# Patient Record
Sex: Male | Born: 1950 | Race: White | Hispanic: No | Smoking: Never smoker
Health system: Southern US, Community
[De-identification: ages and names within clinical notes are randomized; demographics above are authoritative.]

## PROBLEM LIST (undated history)

## (undated) DIAGNOSIS — I10 Essential (primary) hypertension: Secondary | ICD-10-CM

## (undated) DIAGNOSIS — C61 Malignant neoplasm of prostate: Secondary | ICD-10-CM

## (undated) DIAGNOSIS — I499 Cardiac arrhythmia, unspecified: Secondary | ICD-10-CM

## (undated) DIAGNOSIS — K219 Gastro-esophageal reflux disease without esophagitis: Secondary | ICD-10-CM

## (undated) DIAGNOSIS — G4730 Sleep apnea, unspecified: Secondary | ICD-10-CM

## (undated) HISTORY — PX: COSMETIC SURGERY: SHX468

## (undated) HISTORY — DX: Sleep apnea, unspecified: G47.30

## (undated) HISTORY — PX: CIRCUMCISION: SUR203

## (undated) HISTORY — PX: BAND HEMORRHOIDECTOMY: SHX1213

## (undated) HISTORY — PX: NASAL FRACTURE SURGERY: SHX718

## (undated) HISTORY — PX: OTHER SURGICAL HISTORY: SHX169

## (undated) HISTORY — PX: FRACTURE SURGERY: SHX138

---

## 2001-03-25 ENCOUNTER — Encounter: Admission: RE | Admit: 2001-03-25 | Discharge: 2001-03-25 | Payer: Self-pay | Admitting: *Deleted

## 2001-03-25 ENCOUNTER — Encounter: Payer: Self-pay | Admitting: *Deleted

## 2005-09-17 ENCOUNTER — Emergency Department (HOSPITAL_COMMUNITY): Admission: EM | Admit: 2005-09-17 | Discharge: 2005-09-17 | Payer: Self-pay | Admitting: Emergency Medicine

## 2005-09-24 ENCOUNTER — Emergency Department (HOSPITAL_COMMUNITY): Admission: EM | Admit: 2005-09-24 | Discharge: 2005-09-24 | Payer: Self-pay | Admitting: Emergency Medicine

## 2006-08-12 ENCOUNTER — Emergency Department (HOSPITAL_COMMUNITY): Admission: EM | Admit: 2006-08-12 | Discharge: 2006-08-12 | Payer: Self-pay | Admitting: Emergency Medicine

## 2008-03-15 ENCOUNTER — Emergency Department (HOSPITAL_BASED_OUTPATIENT_CLINIC_OR_DEPARTMENT_OTHER): Admission: EM | Admit: 2008-03-15 | Discharge: 2008-03-15 | Payer: Self-pay | Admitting: Emergency Medicine

## 2010-06-04 ENCOUNTER — Encounter: Payer: Self-pay | Admitting: Occupational Medicine

## 2015-10-31 ENCOUNTER — Other Ambulatory Visit: Payer: Self-pay | Admitting: Orthopaedic Surgery

## 2015-11-10 ENCOUNTER — Encounter (HOSPITAL_BASED_OUTPATIENT_CLINIC_OR_DEPARTMENT_OTHER): Payer: Self-pay | Admitting: *Deleted

## 2015-11-16 NOTE — H&P (Signed)
Raymond Vega is an 65 y.o. male.   Chief Complaint: Right elbow pain HPI: Raymond Vega continues with right elbow swelling and a sharp pain.  We aspirated the elbow about a month back but the fluid came back.  He has trouble resting his elbow on anything.  He wonders what can be done as he has been struggling with this for about a year.    Radiographs:  X-rays that were ordered, performed, and interpreted by me today included 2 views of the right elbow.  He does have what appears to be a broken olecranon spur with some surrounding soft tissue swelling.  Past Medical History  Diagnosis Date  . GERD (gastroesophageal reflux disease)     Past Surgical History  Procedure Laterality Date  . Nasal fracture surgery    . Fracture surgery    . Circumcision    . Band hemorrhoidectomy      History reviewed. No pertinent family history. Social History:  reports that he has never smoked. He does not have any smokeless tobacco history on file. He reports that he does not drink alcohol or use illicit drugs.  Allergies: Allergies no known allergies  No prescriptions prior to admission    No results found for this or any previous visit (from the past 48 hour(s)). No results found.  Review of Systems  Musculoskeletal: Positive for joint pain.       Right elbow   All other systems reviewed and are negative.   Height 5\' 11"  (1.803 m), weight 83.915 kg (185 lb). Physical Exam  Constitutional: He is oriented to person, place, and time. He appears well-developed and well-nourished.  HENT:  Head: Normocephalic and atraumatic.  Eyes: Conjunctivae are normal. Pupils are equal, round, and reactive to light.  Neck: Normal range of motion.  Cardiovascular: Normal rate and regular rhythm.   Respiratory: Effort normal.  GI: Soft.  Musculoskeletal:  Right elbow motion is full.  He has a palpable effusion at the olecranon bursa and a loose body that he can feel underneath the skin.  There is no redness.   Sensation and motor function are intact in his hands with palpable pulses on both sides.  Opposite elbow has no such swelling.    Neurological: He is alert and oriented to person, place, and time.  Skin: Skin is warm and dry.  Psychiatric: He has a normal mood and affect. His behavior is normal. Judgment and thought content normal.     Assessment/Plan Assessment: Right olecranon bursitis and loose body aspirated 09/28/15  Plan: Raymond Vega would like to explore the possibility of fixing his elbow surgically.  I reviewed risk of anesthesia, infection, and wound healing problems related to an olecranon bursa excision with removal of the spur.  He would have to wear a sling for maybe 2 weeks.  He might miss work for 2-4 weeks.  We will use the usual 2 sets of sutures to tack down the loose tissues.    Simone Tuckey, Larwance Sachs, PA-C 11/16/2015, 8:19 AM

## 2015-11-18 ENCOUNTER — Encounter (HOSPITAL_BASED_OUTPATIENT_CLINIC_OR_DEPARTMENT_OTHER): Payer: Self-pay | Admitting: *Deleted

## 2015-11-18 ENCOUNTER — Encounter (HOSPITAL_BASED_OUTPATIENT_CLINIC_OR_DEPARTMENT_OTHER)
Admission: RE | Disposition: A | Discharge status: Home / Self Care | Payer: Self-pay | Source: Ambulatory Visit | Attending: Orthopaedic Surgery

## 2015-11-18 ENCOUNTER — Ambulatory Visit (HOSPITAL_BASED_OUTPATIENT_CLINIC_OR_DEPARTMENT_OTHER): Payer: BLUE CROSS/BLUE SHIELD | Admitting: Anesthesiology

## 2015-11-18 ENCOUNTER — Ambulatory Visit (HOSPITAL_BASED_OUTPATIENT_CLINIC_OR_DEPARTMENT_OTHER)
Admission: RE | Admit: 2015-11-18 | Discharge: 2015-11-18 | Disposition: A | Discharge status: Home / Self Care | Payer: BLUE CROSS/BLUE SHIELD | Source: Ambulatory Visit | Attending: Orthopaedic Surgery | Admitting: Orthopaedic Surgery

## 2015-11-18 DIAGNOSIS — M7021 Olecranon bursitis, right elbow: Secondary | ICD-10-CM | POA: Diagnosis not present

## 2015-11-18 DIAGNOSIS — K219 Gastro-esophageal reflux disease without esophagitis: Secondary | ICD-10-CM | POA: Diagnosis not present

## 2015-11-18 DIAGNOSIS — Z79899 Other long term (current) drug therapy: Secondary | ICD-10-CM | POA: Insufficient documentation

## 2015-11-18 DIAGNOSIS — M779 Enthesopathy, unspecified: Secondary | ICD-10-CM | POA: Diagnosis not present

## 2015-11-18 HISTORY — PX: OLECRANON BURSECTOMY: SHX2097

## 2015-11-18 HISTORY — DX: Gastro-esophageal reflux disease without esophagitis: K21.9

## 2015-11-18 SURGERY — BURSECTOMY, ELBOW
Anesthesia: Regional | Site: Elbow | Laterality: Right

## 2015-11-18 MED ORDER — DEXAMETHASONE SODIUM PHOSPHATE 10 MG/ML IJ SOLN
INTRAMUSCULAR | Status: AC
  Filled 2015-11-18: qty 1

## 2015-11-18 MED ORDER — PROMETHAZINE HCL 25 MG/ML IJ SOLN: 6.2500 mg | INTRAMUSCULAR | Status: DC | PRN

## 2015-11-18 MED ORDER — FENTANYL CITRATE (PF) 100 MCG/2ML IJ SOLN
INTRAMUSCULAR | Status: AC
  Filled 2015-11-18: qty 2

## 2015-11-18 MED ORDER — BUPIVACAINE-EPINEPHRINE (PF) 0.5% -1:200000 IJ SOLN
INTRAMUSCULAR | Status: DC | PRN
  Administered 2015-11-18: 30 mL via PERINEURAL
  Administered 2015-11-18: 10 mL via PERINEURAL

## 2015-11-18 MED ORDER — LACTATED RINGERS IV SOLN
INTRAVENOUS | Status: DC
  Administered 2015-11-18: 13:00:00 via INTRAVENOUS

## 2015-11-18 MED ORDER — CEFAZOLIN SODIUM-DEXTROSE 2-4 GM/100ML-% IV SOLN
2.0000 g | INTRAVENOUS | Status: AC
  Administered 2015-11-18: 2 g via INTRAVENOUS

## 2015-11-18 MED ORDER — MEPERIDINE HCL 25 MG/ML IJ SOLN: 6.2500 mg | INTRAMUSCULAR | Status: DC | PRN

## 2015-11-18 MED ORDER — CHLORHEXIDINE GLUCONATE 4 % EX LIQD: 60.0000 mL | Freq: Once | CUTANEOUS | Status: DC

## 2015-11-18 MED ORDER — SCOPOLAMINE 1 MG/3DAYS TD PT72: 1.0000 | MEDICATED_PATCH | Freq: Once | TRANSDERMAL | Status: DC | PRN

## 2015-11-18 MED ORDER — DEXAMETHASONE SODIUM PHOSPHATE 10 MG/ML IJ SOLN
INTRAMUSCULAR | Status: DC | PRN
  Administered 2015-11-18: 10 mg via INTRAVENOUS

## 2015-11-18 MED ORDER — HYDROMORPHONE HCL 1 MG/ML IJ SOLN: 0.2500 mg | INTRAMUSCULAR | Status: DC | PRN

## 2015-11-18 MED ORDER — LACTATED RINGERS IV SOLN
INTRAVENOUS | Status: DC
  Administered 2015-11-18 (×2): via INTRAVENOUS

## 2015-11-18 MED ORDER — FENTANYL CITRATE (PF) 100 MCG/2ML IJ SOLN
50.0000 ug | INTRAMUSCULAR | Status: DC | PRN
  Administered 2015-11-18: 100 ug via INTRAVENOUS

## 2015-11-18 MED ORDER — GLYCOPYRROLATE 0.2 MG/ML IJ SOLN: 0.2000 mg | Freq: Once | INTRAMUSCULAR | Status: DC | PRN

## 2015-11-18 MED ORDER — KETOROLAC TROMETHAMINE 30 MG/ML IJ SOLN
INTRAMUSCULAR | Status: AC
  Filled 2015-11-18: qty 1

## 2015-11-18 MED ORDER — LIDOCAINE 2% (20 MG/ML) 5 ML SYRINGE
INTRAMUSCULAR | Status: AC
  Filled 2015-11-18: qty 5

## 2015-11-18 MED ORDER — PROPOFOL 10 MG/ML IV BOLUS
INTRAVENOUS | Status: DC | PRN
  Administered 2015-11-18: 150 mg via INTRAVENOUS

## 2015-11-18 MED ORDER — ONDANSETRON HCL 4 MG/2ML IJ SOLN
INTRAMUSCULAR | Status: AC
  Filled 2015-11-18: qty 2

## 2015-11-18 MED ORDER — ONDANSETRON HCL 4 MG/2ML IJ SOLN
INTRAMUSCULAR | Status: DC | PRN
  Administered 2015-11-18: 4 mg via INTRAVENOUS

## 2015-11-18 MED ORDER — MIDAZOLAM HCL 2 MG/2ML IJ SOLN
INTRAMUSCULAR | Status: AC
  Filled 2015-11-18: qty 2

## 2015-11-18 MED ORDER — MIDAZOLAM HCL 2 MG/2ML IJ SOLN
1.0000 mg | INTRAMUSCULAR | Status: DC | PRN
  Administered 2015-11-18: 2 mg via INTRAVENOUS

## 2015-11-18 MED ORDER — EPHEDRINE SULFATE 50 MG/ML IJ SOLN
INTRAMUSCULAR | Status: DC | PRN
  Administered 2015-11-18 (×2): 10 mg via INTRAVENOUS

## 2015-11-18 MED ORDER — HYDROCODONE-ACETAMINOPHEN 5-325 MG PO TABS: 1.0000 | ORAL_TABLET | Freq: Four times a day (QID) | ORAL | Status: DC | PRN

## 2015-11-18 SURGICAL SUPPLY — 59 items
BANDAGE ACE 3X5.8 VEL STRL LF (GAUZE/BANDAGES/DRESSINGS) ×4 IMPLANT
BANDAGE ACE 4X5 VEL STRL LF (GAUZE/BANDAGES/DRESSINGS) IMPLANT
BLADE SURG 15 STRL LF DISP TIS (BLADE) ×2 IMPLANT
BLADE SURG 15 STRL SS (BLADE) ×4
BNDG CMPR 9X4 STRL LF SNTH (GAUZE/BANDAGES/DRESSINGS) ×1
BNDG ESMARK 4X9 LF (GAUZE/BANDAGES/DRESSINGS) ×2 IMPLANT
CANISTER SUCT 1200ML W/VALVE (MISCELLANEOUS) ×2 IMPLANT
COVER BACK TABLE 60X90IN (DRAPES) ×2 IMPLANT
COVER MAYO STAND STRL (DRAPES) ×2 IMPLANT
CUFF TOURNIQUET SINGLE 18IN (TOURNIQUET CUFF) ×2 IMPLANT
DECANTER SPIKE VIAL GLASS SM (MISCELLANEOUS) IMPLANT
DRAPE EXTREMITY T 121X128X90 (DRAPE) ×2 IMPLANT
DRAPE U-SHAPE 47X51 STRL (DRAPES) ×2 IMPLANT
DRSG EMULSION OIL 3X3 NADH (GAUZE/BANDAGES/DRESSINGS) ×2 IMPLANT
DRSG PAD ABDOMINAL 8X10 ST (GAUZE/BANDAGES/DRESSINGS) ×2 IMPLANT
DURAPREP 26ML APPLICATOR (WOUND CARE) ×2 IMPLANT
ELECT NEEDLE TIP 2.8 STRL (NEEDLE) ×2 IMPLANT
ELECT REM PT RETURN 9FT ADLT (ELECTROSURGICAL) ×2
ELECTRODE REM PT RTRN 9FT ADLT (ELECTROSURGICAL) ×1 IMPLANT
GAUZE SPONGE 4X4 12PLY STRL (GAUZE/BANDAGES/DRESSINGS) ×4 IMPLANT
GLOVE BIO SURGEON STRL SZ 6.5 (GLOVE) ×2 IMPLANT
GLOVE BIO SURGEON STRL SZ8 (GLOVE) ×4 IMPLANT
GLOVE BIOGEL PI IND STRL 7.0 (GLOVE) ×2 IMPLANT
GLOVE BIOGEL PI IND STRL 8 (GLOVE) ×2 IMPLANT
GLOVE BIOGEL PI INDICATOR 7.0 (GLOVE) ×2
GLOVE BIOGEL PI INDICATOR 8 (GLOVE) ×2
GOWN STRL REUS W/ TWL LRG LVL3 (GOWN DISPOSABLE) ×1 IMPLANT
GOWN STRL REUS W/ TWL XL LVL3 (GOWN DISPOSABLE) ×2 IMPLANT
GOWN STRL REUS W/TWL LRG LVL3 (GOWN DISPOSABLE) ×2
GOWN STRL REUS W/TWL XL LVL3 (GOWN DISPOSABLE) ×4
NEEDLE HYPO 25X1 1.5 SAFETY (NEEDLE) IMPLANT
NS IRRIG 1000ML POUR BTL (IV SOLUTION) ×2 IMPLANT
PACK BASIN DAY SURGERY FS (CUSTOM PROCEDURE TRAY) ×2 IMPLANT
PAD CAST 3X4 CTTN HI CHSV (CAST SUPPLIES) ×1 IMPLANT
PAD CAST 4YDX4 CTTN HI CHSV (CAST SUPPLIES) IMPLANT
PADDING CAST ABS 4INX4YD NS (CAST SUPPLIES) ×1
PADDING CAST ABS COTTON 4X4 ST (CAST SUPPLIES) ×1 IMPLANT
PADDING CAST COTTON 3X4 STRL (CAST SUPPLIES) ×2
PADDING CAST COTTON 4X4 STRL (CAST SUPPLIES)
PENCIL BUTTON HOLSTER BLD 10FT (ELECTRODE) ×2 IMPLANT
SLEEVE SCD COMPRESS KNEE MED (MISCELLANEOUS) IMPLANT
SPLINT PLASTER CAST XFAST 4X15 (CAST SUPPLIES) IMPLANT
SPLINT PLASTER XTRA FAST SET 4 (CAST SUPPLIES)
STOCKINETTE 4X48 STRL (DRAPES) ×2 IMPLANT
STRIP CLOSURE SKIN 1/4X4 (GAUZE/BANDAGES/DRESSINGS) ×2 IMPLANT
SUCTION FRAZIER HANDLE 10FR (MISCELLANEOUS) ×1
SUCTION TUBE FRAZIER 10FR DISP (MISCELLANEOUS) ×1 IMPLANT
SUT ETHILON 4 0 PS 2 18 (SUTURE) ×2 IMPLANT
SUT VIC AB 2-0 PS2 27 (SUTURE) IMPLANT
SUT VIC AB 2-0 SH 27 (SUTURE)
SUT VIC AB 2-0 SH 27XBRD (SUTURE) IMPLANT
SUT VIC AB 3-0 PS1 18 (SUTURE)
SUT VIC AB 3-0 PS1 18XBRD (SUTURE) IMPLANT
SYR BULB 3OZ (MISCELLANEOUS) ×2 IMPLANT
SYR CONTROL 10ML LL (SYRINGE) IMPLANT
TOWEL OR 17X24 6PK STRL BLUE (TOWEL DISPOSABLE) ×2 IMPLANT
TOWEL OR NON WOVEN STRL DISP B (DISPOSABLE) ×2 IMPLANT
TUBE CONNECTING 20X1/4 (TUBING) IMPLANT
UNDERPAD 30X30 (UNDERPADS AND DIAPERS) ×2 IMPLANT

## 2015-11-18 NOTE — Anesthesia Preprocedure Evaluation (Signed)
Anesthesia Evaluation  Patient identified by MRN, date of birth, ID band Patient awake    Reviewed: Allergy & Precautions, NPO status , Patient's Chart, lab work & pertinent test results  Airway Mallampati: II  TM Distance: >3 FB Neck ROM: Full    Dental no notable dental hx.    Pulmonary neg pulmonary ROS,    Pulmonary exam normal breath sounds clear to auscultation       Cardiovascular negative cardio ROS Normal cardiovascular exam Rhythm:Regular Rate:Normal     Neuro/Psych negative neurological ROS  negative psych ROS   GI/Hepatic negative GI ROS, Neg liver ROS,   Endo/Other  negative endocrine ROS  Renal/GU negative Renal ROS  negative genitourinary   Musculoskeletal negative musculoskeletal ROS (+)   Abdominal   Peds negative pediatric ROS (+)  Hematology negative hematology ROS (+)   Anesthesia Other Findings   Reproductive/Obstetrics negative OB ROS                             Anesthesia Physical Anesthesia Plan  ASA: II  Anesthesia Plan: Regional   Post-op Pain Management:    Induction:   Airway Management Planned:   Additional Equipment:   Intra-op Plan:   Post-operative Plan:   Informed Consent: I have reviewed the patients History and Physical, chart, labs and discussed the procedure including the risks, benefits and alternatives for the proposed anesthesia with the patient or authorized representative who has indicated his/her understanding and acceptance.   Dental advisory given  Plan Discussed with: CRNA  Anesthesia Plan Comments:         Anesthesia Quick Evaluation

## 2015-11-18 NOTE — Transfer of Care (Signed)
Immediate Anesthesia Transfer of Care Note  Patient: Raymond Vega  Procedure(s) Performed: Procedure(s): RIGHT ELBOW BURSECTOMY AND OLECRANON SPUR EXCISION (Right)  Patient Location: PACU  Anesthesia Type:GA combined with regional for post-op pain  Level of Consciousness: awake, sedated and responds to stimulation  Airway & Oxygen Therapy: Patient Spontanous Breathing and Patient connected to face mask oxygen  Post-op Assessment: Report given to RN, Post -op Vital signs reviewed and stable and Patient moving all extremities  Post vital signs: Reviewed and stable  Last Vitals:  Filed Vitals:   11/18/15 1330 11/18/15 1345  BP: 110/63   Pulse: 69 77  Temp:    Resp: 11 17    Last Pain: There were no vitals filed for this visit.    Patients Stated Pain Goal: 0 ([id number])  Complications: No apparent anesthesia complications

## 2015-11-18 NOTE — Anesthesia Procedure Notes (Addendum)
Anesthesia Regional Block:  Axillary brachial plexus block  Pre-Anesthetic Checklist: ,, timeout performed, Correct Patient, Correct Site, Correct Laterality, Correct Procedure, Correct Position, site marked, Risks and benefits discussed, Surgical consent,  Pre-op evaluation,  Post-op pain management  Laterality: Right  Prep: chloraprep       Needles:  Injection technique: Single-shot  Needle Type: Stimiplex          Additional Needles:  Procedures: ultrasound guided (picture in chart) Axillary brachial plexus block Narrative:  Injection made incrementally with aspirations every 5 mL.  Performed by: Personally  Anesthesiologist: Nolon Nations  Additional Notes: Risks, benefits and alternative to block explained extensively. Good perineural spread. Patient tolerated procedure well, without complications. Pt didn't have complete musculocutaneous vs. radial nerve distribution of blockade. Additional local given at musculocutaneous and radial, as well as median nerve branches as median didn't appear to have local surrounding it. Post block the patient complained of pressure in his wrist, likely due to additional local around median nerve. Will follow up with patient post op.    Procedure Name: LMA Insertion Date/Time: 11/18/2015 2:16 PM Performed by: Maryella Shivers Pre-anesthesia Checklist: Patient identified, Emergency Drugs available, Suction available and Patient being monitored Patient Re-evaluated:Patient Re-evaluated prior to inductionOxygen Delivery Method: Circle system utilized Preoxygenation: Pre-oxygenation with 100% oxygen Intubation Type: IV induction Ventilation: Mask ventilation without difficulty LMA: LMA inserted LMA Size: 5.0 Number of attempts: 1 Airway Equipment and Method: Bite block Placement Confirmation: positive ETCO2 Tube secured with: Tape Dental Injury: Teeth and Oropharynx as per pre-operative assessment

## 2015-11-18 NOTE — Progress Notes (Signed)
Assisted Dr. Germeroth with right, ultrasound guided, axillary block. Side rails up, monitors on throughout procedure. See vital signs in flow sheet. Tolerated Procedure well. 

## 2015-11-18 NOTE — Anesthesia Postprocedure Evaluation (Signed)
Anesthesia Post Note  Patient: Raymond Vega  Procedure(s) Performed: Procedure(s) (LRB): RIGHT ELBOW BURSECTOMY AND OLECRANON SPUR EXCISION (Right)  Patient location during evaluation: PACU Anesthesia Type: General Level of consciousness: awake, awake and alert and oriented Pain management: pain level controlled Vital Signs Assessment: post-procedure vital signs reviewed and stable Respiratory status: spontaneous breathing, nonlabored ventilation and respiratory function stable Cardiovascular status: blood pressure returned to baseline Anesthetic complications: no    Last Vitals:  Filed Vitals:   11/18/15 1530 11/18/15 1545  BP: 138/81 140/82  Pulse: 73 70  Temp:    Resp: 14 11    Last Pain:  Filed Vitals:   11/18/15 1555  PainSc: 2                  Jaleah Lefevre COKER

## 2015-11-18 NOTE — Op Note (Signed)
#  [id number] 

## 2015-11-18 NOTE — Discharge Instructions (Signed)
° ° °  Regional Anesthesia Blocks ° °1. Numbness or the inability to move the "blocked" extremity may last from 3-48 hours after placement. The length of time depends on the medication injected and your individual response to the medication. If the numbness is not going away after 48 hours, call your surgeon. ° °2. The extremity that is blocked will need to be protected until the numbness is gone and the  Strength has returned. Because you cannot feel it, you will need to take extra care to avoid injury. Because it may be weak, you may have difficulty moving it or using it. You may not know what position it is in without looking at it while the block is in effect. ° °3. For blocks in the legs and feet, returning to weight bearing and walking needs to be done carefully. You will need to wait until the numbness is entirely gone and the strength has returned. You should be able to move your leg and foot normally before you try and bear weight or walk. You will need someone to be with you when you first try to ensure you do not fall and possibly risk injury. ° °4. Bruising and tenderness at the needle site are common side effects and will resolve in a few days. ° °5. Persistent numbness or new problems with movement should be communicated to the surgeon or the [facility] ([phone])/ [facility] ([phone]). ° ° ° °Post Anesthesia Home Care Instructions ° °Activity: °Get plenty of rest for the remainder of the day. A responsible adult should stay with you for 24 hours following the procedure.  °For the next 24 hours, DO NOT: °-Drive a car °-Operate machinery °-Drink alcoholic beverages °-Take any medication unless instructed by your physician °-Make any legal decisions or sign important papers. ° °Meals: °Start with liquid foods such as gelatin or soup. Progress to regular foods as tolerated. Avoid greasy, spicy, heavy foods. If nausea and/or vomiting occur, drink only clear liquids until  the nausea and/or vomiting subsides. Call your physician if vomiting continues. ° °Special Instructions/Symptoms: °Your throat may feel dry or sore from the anesthesia or the breathing tube placed in your throat during surgery. If this causes discomfort, gargle with warm salt water. The discomfort should disappear within 24 hours. ° °If you had a scopolamine patch placed behind your ear for the management of post- operative nausea and/or vomiting: ° °1. The medication in the patch is effective for 72 hours, after which it should be removed.  Wrap patch in a tissue and discard in the trash. Wash hands thoroughly with soap and water. °2. You may remove the patch earlier than 72 hours if you experience unpleasant side effects which may include dry mouth, dizziness or visual disturbances. °3. Avoid touching the patch. Wash your hands with soap and water after contact with the patch. °  ° °

## 2015-11-18 NOTE — Interval H&P Note (Signed)
History and Physical Interval Note:  11/18/2015 1:46 PM  Raymond Vega  has presented today for surgery, with the diagnosis of [name]  The various methods of treatment have been discussed with the patient and family. After consideration of risks, benefits and other options for treatment, the patient has consented to  Procedure(s): Whit[place] (Right) as a surgical intervention .  The patient's history has been reviewed, patient examined, no change in status, stable for surgery.  I have reviewed the patient's chart and labs.  Questions were answered to the patient's satisfaction.     Creg Gilmer G

## 2015-11-21 NOTE — Op Note (Signed)
NAME:  JHETT, NEIRA                  ACCOUNT NO.:  MEDICAL RECORD NO.:  [id number]  LOCATION:                                 FACILITY:  PHYSICIAN:  Monico Blitz. Rhona Raider, M.D.     DATE OF BIRTH:  DATE OF PROCEDURE:  11/18/2015 DATE OF DISCHARGE:                              OPERATIVE REPORT   PREOPERATIVE DIAGNOSES: 1. Right olecranon bursitis. 2. Right elbow triceps insertional spur.  POSTOPERATIVE DIAGNOSES: 1. Right olecranon bursitis. 2. Right elbow triceps insertional spur.  PROCEDURES: 1. Right elbow olecranon bursectomy. 2. Right elbow spur excision.  ANESTHESIA:  General and block.  ATTENDING SURGEON:  Monico Blitz. Rhona Raider, M.D.  ASSISTANT:  Loni Dolly, PA.  INDICATION FOR PROCEDURE:  The patient is a 65 year old man with long history of a painful swollen right elbow bursa.  This is persisted despite multiple aspirations and pads and rest.  By x-ray, he has a loose body in the bursa along with an olecranon spur.  He is offered excision and the potential repair the triceps.  Informed operative consent was obtained after discussion of possible complications including reaction to anesthesia, infection, and wound healing problems.  SUMMARY OF FINDINGS AND PROCEDURE:  Under general anesthesia and a block, a right elbow olecranon bursectomy was performed along with excision of a triceps insertional spur.  He was closed primarily and discharged home same day.  DESCRIPTION OF PROCEDURE:  The patient was taken to the operating suite, where general anesthetic was applied without difficulty.  He was also given a block in the pre-anesthesia area.  He was positioned supine and prepped and draped in normal sterile fashion.  After the administration of IV Kefzol and an appropriate time out, the right arm was elevated, exsanguinated, and a tourniquet inflated about the upper arm.  We made a longitudinal incision over the olecranon bursa with dissection down to the structure  which was excised.  I then found a spur at the tip of the olecranon.  I split the triceps slightly and excised the spur. Fluoroscopy was used to confirm adequate resection.  We did not have to perform significant triceps repair.  The wound was then irrigated, followed by reapproximation of the wound.  I used some Prolene suture to tack down the skin to underlying structures eliminating dead space.  I then closed the incision itself with vertical mattresses of nylon. Adaptic was applied followed by dry gauze and a loose Ace wrap.  ESTIMATED BLOOD LOSS AND FLUIDS:  Can be obtained from anesthesia records as can accurate tourniquet time.  DISPOSITION:  The patient was extubated in the operating room and taken to the recovery room in stable addition.  He was to go home same-day and follow up in the office in less than a week.  I will contact him by phone tonight.     Monico Blitz Rhona Raider, M.D.     PGD/MEDQ  D:  11/18/2015  T:  11/19/2015  Job:  [id number]

## 2015-11-24 ENCOUNTER — Encounter (HOSPITAL_BASED_OUTPATIENT_CLINIC_OR_DEPARTMENT_OTHER): Payer: Self-pay | Admitting: Orthopaedic Surgery

## 2016-12-24 ENCOUNTER — Encounter (HOSPITAL_COMMUNITY): Payer: Self-pay | Admitting: Certified Registered Nurse Anesthetist

## 2016-12-24 ENCOUNTER — Ambulatory Visit (HOSPITAL_COMMUNITY)
Admission: EM | Admit: 2016-12-24 | Discharge: 2016-12-24 | Disposition: A | Discharge status: Home / Self Care | Payer: BLUE CROSS/BLUE SHIELD | Attending: Emergency Medicine | Admitting: Emergency Medicine

## 2016-12-24 ENCOUNTER — Emergency Department (HOSPITAL_COMMUNITY): Payer: BLUE CROSS/BLUE SHIELD

## 2016-12-24 ENCOUNTER — Encounter (HOSPITAL_COMMUNITY)
Admission: EM | Disposition: A | Discharge status: Home / Self Care | Payer: Self-pay | Source: Home / Self Care | Attending: Emergency Medicine

## 2016-12-24 ENCOUNTER — Emergency Department (HOSPITAL_COMMUNITY): Payer: BLUE CROSS/BLUE SHIELD | Admitting: Anesthesiology

## 2016-12-24 DIAGNOSIS — W270XXA Contact with workbench tool, initial encounter: Secondary | ICD-10-CM | POA: Insufficient documentation

## 2016-12-24 DIAGNOSIS — S61215A Laceration without foreign body of left ring finger without damage to nail, initial encounter: Secondary | ICD-10-CM | POA: Insufficient documentation

## 2016-12-24 DIAGNOSIS — Z79899 Other long term (current) drug therapy: Secondary | ICD-10-CM | POA: Diagnosis not present

## 2016-12-24 DIAGNOSIS — S56122A Laceration of flexor muscle, fascia and tendon of left index finger at forearm level, initial encounter: Secondary | ICD-10-CM | POA: Diagnosis not present

## 2016-12-24 DIAGNOSIS — S61213A Laceration without foreign body of left middle finger without damage to nail, initial encounter: Secondary | ICD-10-CM | POA: Insufficient documentation

## 2016-12-24 DIAGNOSIS — S64491A Injury of digital nerve of left index finger, initial encounter: Secondary | ICD-10-CM | POA: Insufficient documentation

## 2016-12-24 DIAGNOSIS — S62621B Displaced fracture of medial phalanx of left index finger, initial encounter for open fracture: Secondary | ICD-10-CM | POA: Insufficient documentation

## 2016-12-24 DIAGNOSIS — S55011A Laceration of ulnar artery at forearm level, right arm, initial encounter: Secondary | ICD-10-CM | POA: Insufficient documentation

## 2016-12-24 DIAGNOSIS — K219 Gastro-esophageal reflux disease without esophagitis: Secondary | ICD-10-CM | POA: Diagnosis not present

## 2016-12-24 DIAGNOSIS — S55112A Laceration of radial artery at forearm level, left arm, initial encounter: Secondary | ICD-10-CM | POA: Insufficient documentation

## 2016-12-24 HISTORY — PX: NERVE AND ARTERY REPAIR: SHX5694

## 2016-12-24 LAB — CBC
HEMATOCRIT: 38.1 % — AB (ref 39.0–52.0)
HEMOGLOBIN: 13.2 g/dL (ref 13.0–17.0)
MCH: 29.5 pg (ref 26.0–34.0)
MCHC: 34.6 g/dL (ref 30.0–36.0)
MCV: 85 fL (ref 78.0–100.0)
Platelets: 206 10*3/uL (ref 150–400)
RBC: 4.48 MIL/uL (ref 4.22–5.81)
RDW: 12.1 % (ref 11.5–15.5)
WBC: 11.3 10*3/uL — AB (ref 4.0–10.5)

## 2016-12-24 SURGERY — NERVE AND ARTERY REPAIR
Anesthesia: General | Laterality: Left

## 2016-12-24 MED ORDER — ONDANSETRON HCL 4 MG/2ML IJ SOLN
INTRAMUSCULAR | Status: AC
  Filled 2016-12-24: qty 2

## 2016-12-24 MED ORDER — FENTANYL CITRATE (PF) 100 MCG/2ML IJ SOLN
INTRAMUSCULAR | Status: AC
  Filled 2016-12-24: qty 2

## 2016-12-24 MED ORDER — TETANUS-DIPHTH-ACELL PERTUSSIS 5-2.5-18.5 LF-MCG/0.5 IM SUSP
0.5000 mL | Freq: Once | INTRAMUSCULAR | Status: AC
  Administered 2016-12-24: 0.5 mL via INTRAMUSCULAR
  Filled 2016-12-24: qty 0.5

## 2016-12-24 MED ORDER — PROMETHAZINE HCL 25 MG/ML IJ SOLN: 6.2500 mg | INTRAMUSCULAR | Status: DC | PRN

## 2016-12-24 MED ORDER — HYDROMORPHONE HCL 1 MG/ML IJ SOLN: 0.2500 mg | INTRAMUSCULAR | Status: DC | PRN

## 2016-12-24 MED ORDER — FENTANYL CITRATE (PF) 100 MCG/2ML IJ SOLN
50.0000 ug | INTRAMUSCULAR | Status: DC | PRN
  Administered 2016-12-24: 50 ug via NASAL

## 2016-12-24 MED ORDER — OXYCODONE-ACETAMINOPHEN 5-325 MG PO TABS: 1.0000 | ORAL_TABLET | ORAL | 0 refills | Status: DC | PRN

## 2016-12-24 MED ORDER — SODIUM CHLORIDE 0.9 % IR SOLN
Status: DC | PRN
  Administered 2016-12-24: 1000 mL

## 2016-12-24 MED ORDER — MIDAZOLAM HCL 5 MG/5ML IJ SOLN
INTRAMUSCULAR | Status: DC | PRN
  Administered 2016-12-24: 2 mg via INTRAVENOUS

## 2016-12-24 MED ORDER — LACTATED RINGERS IV SOLN
INTRAVENOUS | Status: DC | PRN
  Administered 2016-12-24 (×2): via INTRAVENOUS

## 2016-12-24 MED ORDER — EPHEDRINE SULFATE 50 MG/ML IJ SOLN
INTRAMUSCULAR | Status: DC | PRN
  Administered 2016-12-24 (×3): 10 mg via INTRAVENOUS

## 2016-12-24 MED ORDER — MORPHINE SULFATE (PF) 4 MG/ML IV SOLN
8.0000 mg | Freq: Once | INTRAVENOUS | Status: AC
Start: 2016-12-24 — End: 2016-12-24
  Administered 2016-12-24: 4 mg via INTRAVENOUS
  Filled 2016-12-24: qty 2

## 2016-12-24 MED ORDER — MORPHINE SULFATE (PF) 4 MG/ML IV SOLN: 4.0000 mg | Freq: Once | INTRAVENOUS | Status: DC

## 2016-12-24 MED ORDER — OXYCODONE HCL 5 MG/5ML PO SOLN: 5.0000 mg | Freq: Once | ORAL | Status: DC | PRN

## 2016-12-24 MED ORDER — OXYCODONE HCL 5 MG PO TABS: 5.0000 mg | ORAL_TABLET | Freq: Once | ORAL | Status: DC | PRN

## 2016-12-24 MED ORDER — PROPOFOL 10 MG/ML IV BOLUS
INTRAVENOUS | Status: DC | PRN
Start: 2016-12-24 — End: 2016-12-24
  Administered 2016-12-24: 150 mg via INTRAVENOUS

## 2016-12-24 MED ORDER — FENTANYL CITRATE (PF) 100 MCG/2ML IJ SOLN
INTRAMUSCULAR | Status: DC | PRN
  Administered 2016-12-24 (×4): 50 ug via INTRAVENOUS

## 2016-12-24 MED ORDER — ONDANSETRON HCL 4 MG/2ML IJ SOLN
INTRAMUSCULAR | Status: DC | PRN
  Administered 2016-12-24: 4 mg via INTRAVENOUS

## 2016-12-24 MED ORDER — FENTANYL CITRATE (PF) 250 MCG/5ML IJ SOLN
INTRAMUSCULAR | Status: AC
  Filled 2016-12-24: qty 5

## 2016-12-24 MED ORDER — BUPIVACAINE HCL (PF) 0.5 % IJ SOLN
30.0000 mL | Freq: Once | INTRAMUSCULAR | Status: DC
  Filled 2016-12-24: qty 30

## 2016-12-24 MED ORDER — BUPIVACAINE HCL (PF) 0.5 % IJ SOLN
INTRAMUSCULAR | Status: DC | PRN
  Administered 2016-12-24: 4 mL

## 2016-12-24 MED ORDER — ONDANSETRON HCL 4 MG/2ML IJ SOLN
4.0000 mg | Freq: Once | INTRAMUSCULAR | Status: AC
  Administered 2016-12-24: 4 mg via INTRAVENOUS
  Filled 2016-12-24: qty 2

## 2016-12-24 MED ORDER — CEFAZOLIN SODIUM-DEXTROSE 2-4 GM/100ML-% IV SOLN
2.0000 g | Freq: Once | INTRAVENOUS | Status: AC
  Administered 2016-12-24: 2 g via INTRAVENOUS
  Filled 2016-12-24: qty 100

## 2016-12-24 MED ORDER — MIDAZOLAM HCL 2 MG/2ML IJ SOLN
INTRAMUSCULAR | Status: AC
  Filled 2016-12-24: qty 2

## 2016-12-24 MED ORDER — PROPOFOL 10 MG/ML IV BOLUS
INTRAVENOUS | Status: AC
  Filled 2016-12-24: qty 20

## 2016-12-24 MED ORDER — LIDOCAINE HCL (CARDIAC) 20 MG/ML IV SOLN
INTRAVENOUS | Status: DC | PRN
  Administered 2016-12-24: 100 mg via INTRAVENOUS

## 2016-12-24 MED ORDER — LACTATED RINGERS IV SOLN
INTRAVENOUS | Status: DC
  Administered 2016-12-24: 18:00:00 via INTRAVENOUS

## 2016-12-24 MED ORDER — PHENYLEPHRINE 40 MCG/ML (10ML) SYRINGE FOR IV PUSH (FOR BLOOD PRESSURE SUPPORT)
PREFILLED_SYRINGE | INTRAVENOUS | Status: AC
  Filled 2016-12-24: qty 10

## 2016-12-24 MED ORDER — MORPHINE SULFATE (PF) 4 MG/ML IV SOLN
4.0000 mg | Freq: Once | INTRAVENOUS | Status: AC
  Administered 2016-12-24: 4 mg via INTRAVENOUS
  Filled 2016-12-24: qty 1

## 2016-12-24 MED ORDER — KETOROLAC TROMETHAMINE 30 MG/ML IJ SOLN: 30.0000 mg | Freq: Once | INTRAMUSCULAR | Status: DC | PRN

## 2016-12-24 MED ORDER — DEXAMETHASONE SODIUM PHOSPHATE 10 MG/ML IJ SOLN
INTRAMUSCULAR | Status: DC | PRN
  Administered 2016-12-24: 10 mg via INTRAVENOUS

## 2016-12-24 MED ORDER — MEPERIDINE HCL 25 MG/ML IJ SOLN: 6.2500 mg | INTRAMUSCULAR | Status: DC | PRN

## 2016-12-24 SURGICAL SUPPLY — 54 items
BANDAGE ACE 4X5 VEL STRL LF (GAUZE/BANDAGES/DRESSINGS) IMPLANT
BANDAGE ELASTIC 4 VELCRO ST LF (GAUZE/BANDAGES/DRESSINGS) ×2 IMPLANT
BNDG CMPR 9X4 STRL LF SNTH (GAUZE/BANDAGES/DRESSINGS) ×1
BNDG CONFORM 3 STRL LF (GAUZE/BANDAGES/DRESSINGS) ×2 IMPLANT
BNDG ESMARK 4X9 LF (GAUZE/BANDAGES/DRESSINGS) ×2 IMPLANT
BNDG GAUZE ELAST 4 BULKY (GAUZE/BANDAGES/DRESSINGS) ×2 IMPLANT
CORDS BIPOLAR (ELECTRODE) ×2 IMPLANT
COVER SURGICAL LIGHT HANDLE (MISCELLANEOUS) ×2 IMPLANT
CUFF TOURNIQUET SINGLE 18IN (TOURNIQUET CUFF) ×2 IMPLANT
CUFF TOURNIQUET SINGLE 24IN (TOURNIQUET CUFF) IMPLANT
DRAPE OEC MINIVIEW 54X84 (DRAPES) IMPLANT
DRAPE SURG 17X23 STRL (DRAPES) ×2 IMPLANT
DURAPREP 26ML APPLICATOR (WOUND CARE) ×2 IMPLANT
GAUZE SPONGE 4X4 12PLY STRL (GAUZE/BANDAGES/DRESSINGS) IMPLANT
GAUZE SPONGE 4X4 12PLY STRL LF (GAUZE/BANDAGES/DRESSINGS) ×2 IMPLANT
GAUZE XEROFORM 1X8 LF (GAUZE/BANDAGES/DRESSINGS) IMPLANT
GAUZE XEROFORM 5X9 LF (GAUZE/BANDAGES/DRESSINGS) ×2 IMPLANT
GLOVE BIOGEL PI IND STRL 6 (GLOVE) ×1 IMPLANT
GLOVE BIOGEL PI INDICATOR 6 (GLOVE) ×1
GLOVE SURG SYN 8.0 (GLOVE) ×6 IMPLANT
GOWN STRL REUS W/ TWL LRG LVL3 (GOWN DISPOSABLE) ×1 IMPLANT
GOWN STRL REUS W/ TWL XL LVL3 (GOWN DISPOSABLE) ×1 IMPLANT
GOWN STRL REUS W/TWL LRG LVL3 (GOWN DISPOSABLE) ×2
GOWN STRL REUS W/TWL XL LVL3 (GOWN DISPOSABLE) ×2
K-WIRE .035 (WIRE) ×2
KIT BASIN OR (CUSTOM PROCEDURE TRAY) ×2 IMPLANT
KIT ROOM TURNOVER OR (KITS) ×2 IMPLANT
KWIRE .035 (WIRE) ×1 IMPLANT
MANIFOLD NEPTUNE II (INSTRUMENTS) ×2 IMPLANT
NEEDLE HYPO 25GX1X1/2 BEV (NEEDLE) ×2 IMPLANT
NS IRRIG 1000ML POUR BTL (IV SOLUTION) ×2 IMPLANT
PACK ORTHO EXTREMITY (CUSTOM PROCEDURE TRAY) ×2 IMPLANT
PAD ARMBOARD 7.5X6 YLW CONV (MISCELLANEOUS) ×4 IMPLANT
PAD CAST 4YDX4 CTTN HI CHSV (CAST SUPPLIES) IMPLANT
PADDING CAST COTTON 4X4 STRL (CAST SUPPLIES)
SPEAR EYE SURG WECK-CEL (MISCELLANEOUS) ×2 IMPLANT
SPECIMEN JAR SMALL (MISCELLANEOUS) ×2 IMPLANT
STRIP CLOSURE SKIN 1/2X4 (GAUZE/BANDAGES/DRESSINGS) IMPLANT
SUCTION FRAZIER HANDLE 10FR (MISCELLANEOUS)
SUCTION TUBE FRAZIER 10FR DISP (MISCELLANEOUS) IMPLANT
SUT ETHIBOND 3-0 V-5 (SUTURE) IMPLANT
SUT ETHILON 4 0 P 3 18 (SUTURE) ×2 IMPLANT
SUT ETHILON 4 0 PS 2 18 (SUTURE) IMPLANT
SUT ETHILON 5 0 PS 2 18 (SUTURE) IMPLANT
SUT ETHILON 9 0 BV130 4 (SUTURE) ×2 IMPLANT
SUT FIBERWIRE 3-0 18 TAPR NDL (SUTURE) ×2
SUT MNCRL AB 4-0 PS2 18 (SUTURE) ×6 IMPLANT
SUTURE FIBERWR 3-0 18 TAPR NDL (SUTURE) ×1 IMPLANT
SYR CONTROL 10ML LL (SYRINGE) ×2 IMPLANT
TOWEL OR 17X24 6PK STRL BLUE (TOWEL DISPOSABLE) ×2 IMPLANT
TOWEL OR 17X26 10 PK STRL BLUE (TOWEL DISPOSABLE) ×2 IMPLANT
TUBE CONNECTING 12X1/4 (SUCTIONS) IMPLANT
UNDERPAD 30X30 (UNDERPADS AND DIAPERS) ×2 IMPLANT
WATER STERILE IRR 1000ML POUR (IV SOLUTION) ×2 IMPLANT

## 2016-12-24 NOTE — Transfer of Care (Signed)
Immediate Anesthesia Transfer of Care Note  Patient: Raymond Vega  Procedure(s) Performed: Procedure(s): EXPLORATION WITH REPAIRS AS NECESSARY LACERATION HAND (Left)  Patient Location: PACU  Anesthesia Type:General  Level of Consciousness: awake, sedated and patient cooperative  Airway & Oxygen Therapy: Patient connected to nasal cannula oxygen  Post-op Assessment: Report given to RN and Post -op Vital signs reviewed and stable  Post vital signs: Reviewed and stable  Last Vitals:  Vitals:   12/24/16 1730 12/24/16 2052  BP: (!) 149/94   Pulse: 76   Resp: 18   Temp:  (P) 36.5 C  SpO2: 97%     Last Pain:  Vitals:   12/24/16 2052  PainSc: (P) Asleep         Complications: No apparent anesthesia complications

## 2016-12-24 NOTE — ED Notes (Signed)
Pt returned from XR. Pt on monitors 

## 2016-12-24 NOTE — Consult Note (Signed)
Reason for Consult: Left hand table saw injury Referring Physician: Dalon Vega is an 66 y.o. male.  HPI: Patient is a very pleasant Raymond Vega right-hand-dominant male with a table saw injury to his nondominant left hand with involvement of the index, long, and ring fingers. Patient has near amputation of index finger on the left  Past Medical History:  Diagnosis Date  . GERD (gastroesophageal reflux disease)     Past Surgical History:  Procedure Laterality Date  . BAND HEMORRHOIDECTOMY    . CIRCUMCISION    . FRACTURE SURGERY    . NASAL FRACTURE SURGERY    . OLECRANON BURSECTOMY Right 11/18/2015   Procedure: RIGHT ELBOW BURSECTOMY AND OLECRANON SPUR EXCISION;  Surgeon: Melrose Nakayama, MD;  Location: [facility];  Service: Orthopedics;  Laterality: Right;    History reviewed. No pertinent family history.  Social History:  reports that he has never smoked. He does not have any smokeless tobacco history on file. He reports that he does not drink alcohol or use drugs.  Allergies: No Known Allergies  Medications:  Scheduled: . fentaNYL        No results found for this or any previous visit (from the past 48 hour(s)).  Dg Hand Complete Left  Result Date: 12/24/2016 CLINICAL DATA:  Laceration to anterior portion of fingers. EXAM: LEFT HAND - COMPLETE 3+ VIEW COMPARISON:  None. FINDINGS: Three views study shows a comminuted fracture involving the head of the index finger middle phalanx with extension into the articular surface. Overlying prominent soft tissue defect evident. No fracture identified in the middle, ring, or little fingers. Bones of the from appear intact. 2 mm radiopaque soft tissue foreign body is identified anterior to the second metacarpal. IMPRESSION: 1. Comminuted fracture involving the head of the index finger middle phalanx. 2. Tiny soft tissue foreign body identified just anterior to the second metacarpal. Electronically Signed   By: Misty Stanley M.D.   On: 12/24/2016 17:16    Review of Systems  All other systems reviewed and are negative.  Blood pressure (!) 149/94, pulse 76, resp. rate 18, SpO2 97 %. Physical Exam  Constitutional: He is oriented to person, place, and time. He appears well-developed and well-nourished.  HENT:  Head: Normocephalic and atraumatic.  Neck: Normal range of motion.  Cardiovascular: Normal rate.   Respiratory: Effort normal.  Musculoskeletal:       Left hand: He exhibits decreased range of motion, tenderness, disruption of two-point discrimination and laceration.  Left index finger volar laceration at distal interphalangeal joint with loss of flexion, and sensation distally. Complex left long and left ring volar lacerations over middle and distal phalanges with full range of motion.  Neurological: He is alert and oriented to person, place, and time.  Skin: Skin is warm.  Psychiatric: He has a normal mood and affect. His behavior is normal. Judgment and thought content normal.    Assessment/Plan: As above. Plan on exploration and repair of necessary structures to left index, long, and ring fingers.  Sheral Apley [facility] 12/24/2016, 6:20 PM

## 2016-12-24 NOTE — ED Provider Notes (Signed)
[facility] DEPT Provider Note   CSN: [id number] Arrival date & time: 12/24/16  1609     History   Chief Complaint Chief Complaint  Patient presents with  . Hand Injury    HPI Raymond Vega is a 66 y.o. male.  HPI  66 year old male presents with a left table saw injury. This occurred about 45 minutes prior to arrival. His left index finger is almost completely amputated and he has injuries to the third and fourth digits as well. He states he cannot feel his distal second finger. The pain is severe. He was given fentanyl in triage. Last ate at around noon. Unsure when his last tetanus shot was.  Past Medical History:  Diagnosis Date  . GERD (gastroesophageal reflux disease)     There are no active problems to display for this patient.   Past Surgical History:  Procedure Laterality Date  . BAND HEMORRHOIDECTOMY    . CIRCUMCISION    . FRACTURE SURGERY    . NASAL FRACTURE SURGERY    . OLECRANON BURSECTOMY Right 11/18/2015   Procedure: RIGHT ELBOW BURSECTOMY AND OLECRANON SPUR EXCISION;  Surgeon: Melrose Nakayama, MD;  Location: [facility];  Service: Orthopedics;  Laterality: Right;       Home Medications    Prior to Admission medications   Medication Sig Start Date End Date Taking? Authorizing Provider  calcium carbonate (CALCIUM 600) 600 MG TABS tablet Take 600 mg by mouth daily.   Yes [provider]  ibuprofen (ADVIL,MOTRIN) 800 MG tablet Take 400-800 mg by mouth every 8 (eight) hours as needed (pain).   Yes [provider]  Omega-3 Fatty Acids (FISH OIL PO) Take 1 capsule by mouth daily.   Yes [provider]  omeprazole (PRILOSEC) 20 MG capsule Take 20 mg by mouth daily.   Yes [provider]  HYDROcodone-acetaminophen (NORCO) 5-325 MG tablet Take 1-2 tablets by mouth every 6 (six) hours as needed for moderate pain. Patient not taking: Reported on 12/24/2016 11/18/15   Loni Dolly, PA-C  oxyCODONE-acetaminophen  (ROXICET) 5-325 MG tablet Take 1 tablet by mouth every 4 (four) hours as needed for severe pain. 12/24/16   Charlotte Crumb, MD    Family History History reviewed. No pertinent family history.  Social History Social History  Substance Use Topics  . Smoking status: Never Smoker  . Smokeless tobacco: Never Used  . Alcohol use No     Allergies   Patient has no known allergies.   Review of Systems Review of Systems  Skin: Positive for wound.  Neurological: Positive for numbness.  All other systems reviewed and are negative.    Physical Exam Updated Vital Signs BP (!) 153/87 (BP Location: Right Arm)   Pulse 93   Temp 97.7 F (36.5 C)   Resp 14   Ht 5\' 11"  (1.803 m)   Wt 81.6 kg (180 lb)   SpO2 95%   BMI 25.10 kg/m   Physical Exam  Constitutional: He is oriented to person, place, and time. He appears well-developed and well-nourished.  HENT:  Head: Normocephalic and atraumatic.  Right Ear: External ear normal.  Left Ear: External ear normal.  Nose: Nose normal.  Eyes: Right eye exhibits no discharge. Left eye exhibits no discharge.  Neck: Neck supple.  Cardiovascular: Normal rate and regular rhythm.   Pulmonary/Chest: Effort normal.  Musculoskeletal: He exhibits no edema.       Left hand: He exhibits laceration. Decreased sensation noted.  Hands: Overall hand is dirty with dried blood and some grease  Neurological: He is alert and oriented to person, place, and time.  Skin: Skin is warm and dry.  Nursing note and vitals reviewed.    ED Treatments / Results  Labs (all labs ordered are listed, but only abnormal results are displayed) Labs Reviewed  CBC - Abnormal; Notable for the following:       Result Value   WBC 11.3 (*)    HCT 38.1 (*)    All other components within normal limits    EKG  EKG Interpretation None       Radiology Dg Hand Complete Left  Result Date: 12/24/2016 CLINICAL DATA:  Laceration to anterior portion of fingers.  EXAM: LEFT HAND - COMPLETE 3+ VIEW COMPARISON:  None. FINDINGS: Three views study shows a comminuted fracture involving the head of the index finger middle phalanx with extension into the articular surface. Overlying prominent soft tissue defect evident. No fracture identified in the middle, ring, or little fingers. Bones of the from appear intact. 2 mm radiopaque soft tissue foreign body is identified anterior to the second metacarpal. IMPRESSION: 1. Comminuted fracture involving the head of the index finger middle phalanx. 2. Tiny soft tissue foreign body identified just anterior to the second metacarpal. Electronically Signed   By: Misty Stanley M.D.   On: 12/24/2016 17:16    Procedures Procedures (including critical care time)     Medications Ordered in ED Medications  fentaNYL (SUBLIMAZE) injection 50 mcg ( Nasal MAR Hold 12/24/16 1815)  meperidine (DEMEROL) injection 6.25-12.5 mg (not administered)  promethazine (PHENERGAN) injection 6.25-12.5 mg (not administered)  HYDROmorphone (DILAUDID) injection 0.25-0.5 mg (not administered)  ketorolac (TORADOL) 30 MG/ML injection 30 mg (not administered)  oxyCODONE (Oxy IR/ROXICODONE) immediate release tablet 5 mg (not administered)    Or  oxyCODONE (ROXICODONE) 5 MG/5ML solution 5 mg (not administered)  lactated ringers infusion ( Intravenous New Bag/Given 12/24/16 1821)  bupivacaine (MARCAINE) 0.5 % injection 30 mL (not administered)  fentaNYL (SUBLIMAZE) 100 MCG/2ML injection (  Override pull for Anesthesia 12/24/16 1854)  morphine 4 MG/ML injection 8 mg (4 mg Intravenous Given 12/24/16 1644)  ceFAZolin (ANCEF) IVPB 2g/100 mL premix (0 g Intravenous Stopped 12/24/16 1720)  Tdap (BOOSTRIX) injection 0.5 mL (0.5 mLs Intramuscular Given 12/24/16 1650)  morphine 4 MG/ML injection 4 mg (4 mg Intravenous Given 12/24/16 1656)  ondansetron (ZOFRAN) injection 4 mg (4 mg Intravenous Given 12/24/16 1656)     Initial Impression / Assessment and Plan / ED  Course  I have reviewed the triage vital signs and the nursing notes.  Pertinent labs & imaging results that were available during my care of the patient were reviewed by me and considered in my medical decision making (see chart for details).     Patient was given IV Ancef and tetanus immunization. IV morphine for pain. Given his near amputation and dirty wounds, orthopedics, Dr. Burney Gauze was consulted and will take to the operating room. Kept nothing by mouth.  Final Clinical Impressions(s) / ED Diagnoses   Final diagnoses:  Open displaced fracture of middle phalanx of left index finger, initial encounter    New Prescriptions Current Discharge Medication List    START taking these medications   Details  oxyCODONE-acetaminophen (ROXICET) 5-325 MG tablet Take 1 tablet by mouth every 4 (four) hours as needed for severe pain. Qty: 20 tablet, Refills: 0         Sherwood Gambler, MD 12/24/16 2351

## 2016-12-24 NOTE — Anesthesia Procedure Notes (Signed)
Procedure Name: Intubation Date/Time: 12/24/2016 7:04 PM Performed by: Valetta Fuller Pre-anesthesia Checklist: Patient identified, Emergency Drugs available, Suction available and Patient being monitored Patient Re-evaluated:Patient Re-evaluated prior to induction Oxygen Delivery Method: Circle system utilized Preoxygenation: Pre-oxygenation with 100% oxygen Induction Type: IV induction, Rapid sequence and Cricoid Pressure applied Ventilation: Mask ventilation without difficulty Laryngoscope Size: Miller and 2 Grade View: Grade I Tube type: Oral Tube size: 7.5 mm Number of attempts: 1 Airway Equipment and Method: Stylet Placement Confirmation: ETT inserted through vocal cords under direct vision,  positive ETCO2 and breath sounds checked- equal and bilateral Secured at: 23 cm Tube secured with: Tape Dental Injury: Teeth and Oropharynx as per pre-operative assessment

## 2016-12-24 NOTE — ED Triage Notes (Signed)
Pt presents with near amputation to fingers of left hand. The injury occurred while using a table saw this afternoon. He c/o severe pain and feels faint.

## 2016-12-24 NOTE — Anesthesia Preprocedure Evaluation (Addendum)
Anesthesia Evaluation  Patient identified by MRN, date of birth, ID band Patient awake    Reviewed: Allergy & Precautions, NPO status , Patient's Chart, lab work & pertinent test results  Airway Mallampati: II  TM Distance: >3 FB Neck ROM: Full    Dental no notable dental hx.    Pulmonary neg pulmonary ROS,    Pulmonary exam normal breath sounds clear to auscultation       Cardiovascular negative cardio ROS Normal cardiovascular exam Rhythm:Regular Rate:Normal     Neuro/Psych negative neurological ROS  negative psych ROS   GI/Hepatic negative GI ROS, Neg liver ROS, GERD  ,  Endo/Other  negative endocrine ROS  Renal/GU negative Renal ROS     Musculoskeletal negative musculoskeletal ROS (+)   Abdominal   Peds  Hematology negative hematology ROS (+)   Anesthesia Other Findings   Reproductive/Obstetrics negative OB ROS                             Anesthesia Physical  Anesthesia Plan  ASA: II  Anesthesia Plan: General   Post-op Pain Management:    Induction: Intravenous  PONV Risk Score and Plan: 2 and Ondansetron and Dexamethasone  Airway Management Planned: Oral ETT  Additional Equipment:   Intra-op Plan:   Post-operative Plan: Extubation in OR  Informed Consent: I have reviewed the patients History and Physical, chart, labs and discussed the procedure including the risks, benefits and alternatives for the proposed anesthesia with the patient or authorized representative who has indicated his/her understanding and acceptance.   Dental advisory given  Plan Discussed with: CRNA  Anesthesia Plan Comments:       Anesthesia Quick Evaluation

## 2016-12-24 NOTE — Op Note (Signed)
Please see dictated operative report [phone]

## 2016-12-24 NOTE — Op Note (Signed)
NAME:  Raymond Vega, Raymond Vega NO.:  [id number]  MEDICAL RECORD NO.:  [id number]  LOCATION:  [facility]                         FACILITY:  [facility]  PHYSICIAN:  Sheral Apley. Emanuel Dowson, M.D.DATE OF BIRTH:  May 16, 1950  DATE OF PROCEDURE:  12/24/2016 DATE OF DISCHARGE:                              OPERATIVE REPORT   PREOPERATIVE DIAGNOSIS:  Table saw injury, left hand involving index, long, and ring fingers.  POSTOPERATIVE DIAGNOSIS:  Table saw injury, left hand involving index, long, and ring fingers.  PROCEDURES: 1. Exploration above with primary repair of left index finger     profundus tendon. 2. Open treatment of intra-articular fracture, middle phalanx, left     index finger. 3. Microscopic repair of ulnar and radial digital artery and digital     nerve. 4. Complex wound closure, left index finger as well as exploration of     wounds of long and ring finger.  SURGEON:  Sheral Apley. Burney Gauze, M.D.  ASSISTANT:  None.  ANESTHESIA:  General.  COMPLICATION:  No complication.  DRAINS:  No drains.  DESCRIPTION OF PROCEDURE:  The patient was taken to the operating suite. After induction of adequate general anesthetic, the left upper extremity was prepped and draped in the usual sterile fashion.  Once this was done, we explored complex wounds along the volar aspect of the left index, long, and ring fingers.  The long and ring finger had complex wounds along the palmar sides with intact nailbeds and nail fractures. The index finger had a near amputation with transection of the flexor tendon profundus index finger at the DIP flexion crease, both neurovascular bundles, and open fracture of the distal aspect of the middle phalanx at the DIP joint.  We thoroughly irrigated all the wounds.  We then exsanguinated and raised the tourniquet.  We fixed the middle phalangeal intra-articular fracture with a single 0.035 K-wire that was driven out the distal fragment of the middle phalanx  across the DIP joint until it was flushed with the distal aspect of the middle phalanx and then driven retrograde into the proximal shaft of the middle phalanx.  This was done under direct fluoroscopic imaging.  Fluoroscopy then revealed adequate reduction of the joint and the fracture.  The K- wire was cut outside the skin and bent upon itself.  We then fixed the profundus tendon with 3-0 FiberWire suture x2 horizontal mattress sutures followed by a running locked epitendinous-type stitch.  We then brought the microscope onto the field and under microscopic magnification, repaired the ulnar and radial digital artery and nerve. The ulnar digital artery and nerve were coapted with no undue difficulty.  The radial side had significant injury and was only able to hold one suture for the artery and two for the nerve.  We then loosely closed the skin of the index finger with 4-0 Monocryl.  We then approximated the complex volar wound of the long and ring finger with the 4-0 Monocryl as well.  We then dressed with Xeroform, 4x4s, and a compression wrap.  The patient tolerated all these procedures well, went to the recovery room in stable fashion.     Sheral Apley Burney Gauze, M.D.     MAW/MEDQ  D:  12/24/2016  T:  12/24/2016  Job:  [id number]

## 2016-12-25 ENCOUNTER — Encounter (HOSPITAL_COMMUNITY): Payer: Self-pay | Admitting: Orthopedic Surgery

## 2016-12-26 ENCOUNTER — Encounter (HOSPITAL_COMMUNITY): Payer: Self-pay | Admitting: Orthopedic Surgery

## 2016-12-26 NOTE — Anesthesia Postprocedure Evaluation (Signed)
Anesthesia Post Note  Patient: Raymond Vega  Procedure(s) Performed: Procedure(s) (LRB): EXPLORATION WITH REPAIRS AS NECESSARY LACERATION HAND (Left)     Patient location during evaluation: PACU Anesthesia Type: General Level of consciousness: sedated and patient cooperative Pain management: pain level controlled Vital Signs Assessment: post-procedure vital signs reviewed and stable Respiratory status: spontaneous breathing Cardiovascular status: stable Anesthetic complications: no    Last Vitals:  Vitals:   12/24/16 2128 12/24/16 2129  BP: (!) 153/87   Pulse: 91 93  Resp: 17 14  Temp: 36.5 C 36.5 C  SpO2: 97% 95%    Last Pain:  Vitals:   12/24/16 2129  PainSc: 0-No pain                 Nolon Nations

## 2017-02-20 DIAGNOSIS — C61 Malignant neoplasm of prostate: Secondary | ICD-10-CM | POA: Insufficient documentation

## 2018-04-04 IMAGING — DX DG HAND COMPLETE 3+V*L*
3 series · 3 of 3 positions shown · non-contrast
Comparison: None.

CLINICAL DATA: Laceration to anterior portion of fingers.

EXAM:
LEFT HAND - COMPLETE 3+ VIEW

[x hand pa left]
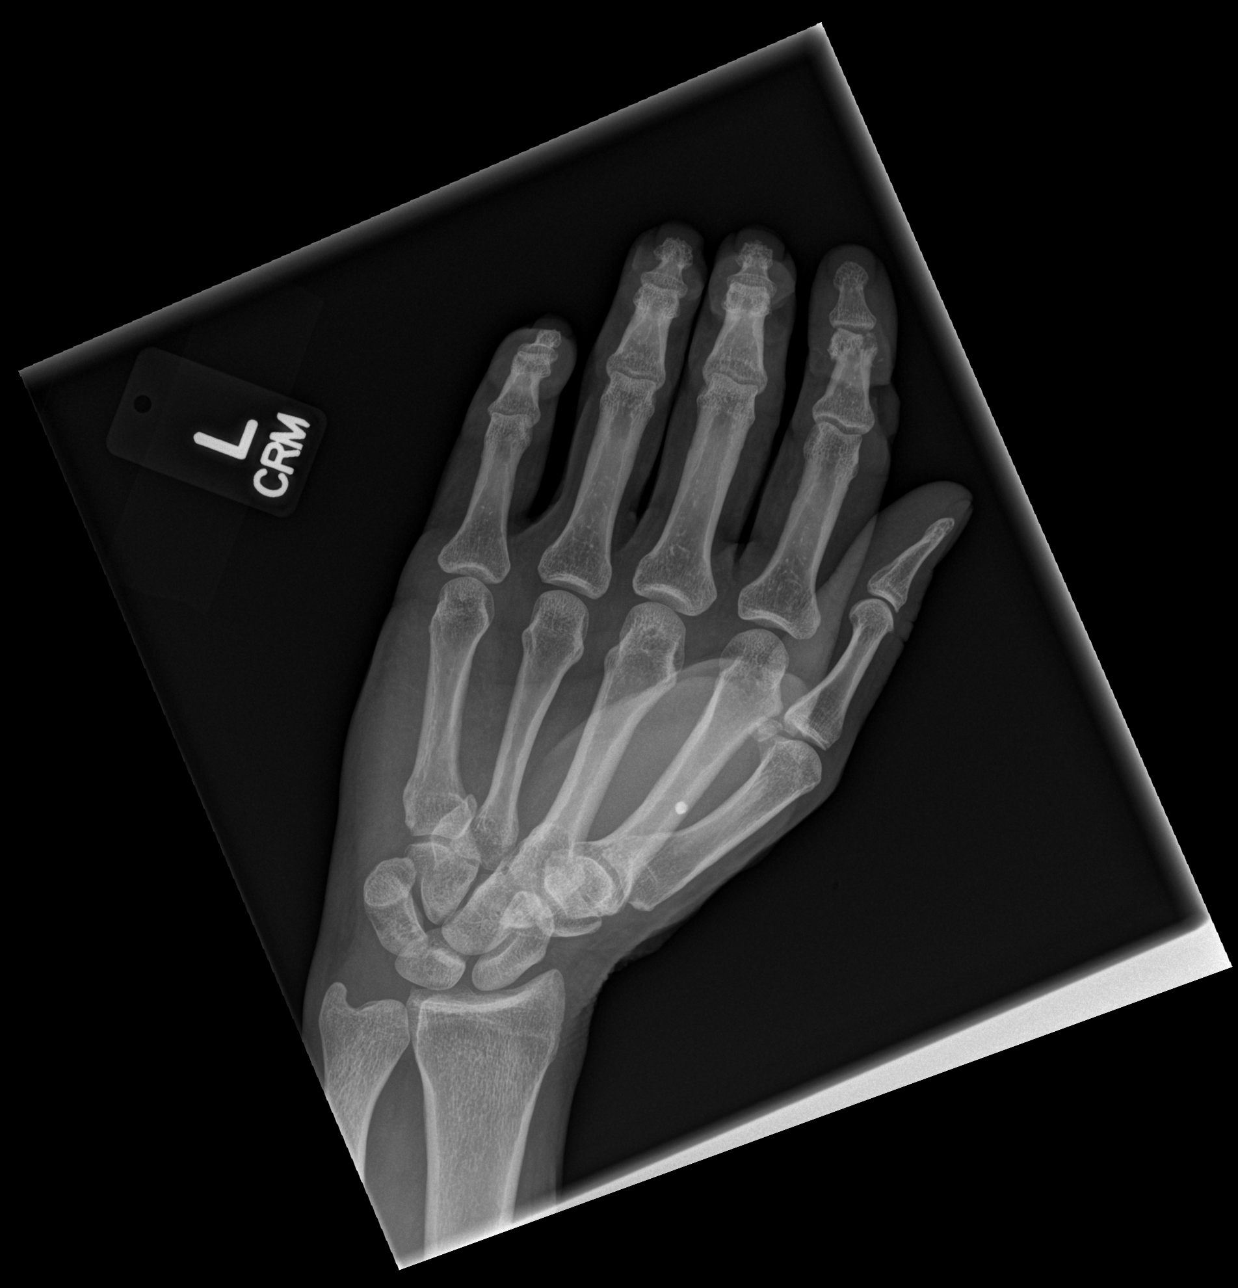

[x hand obl left]
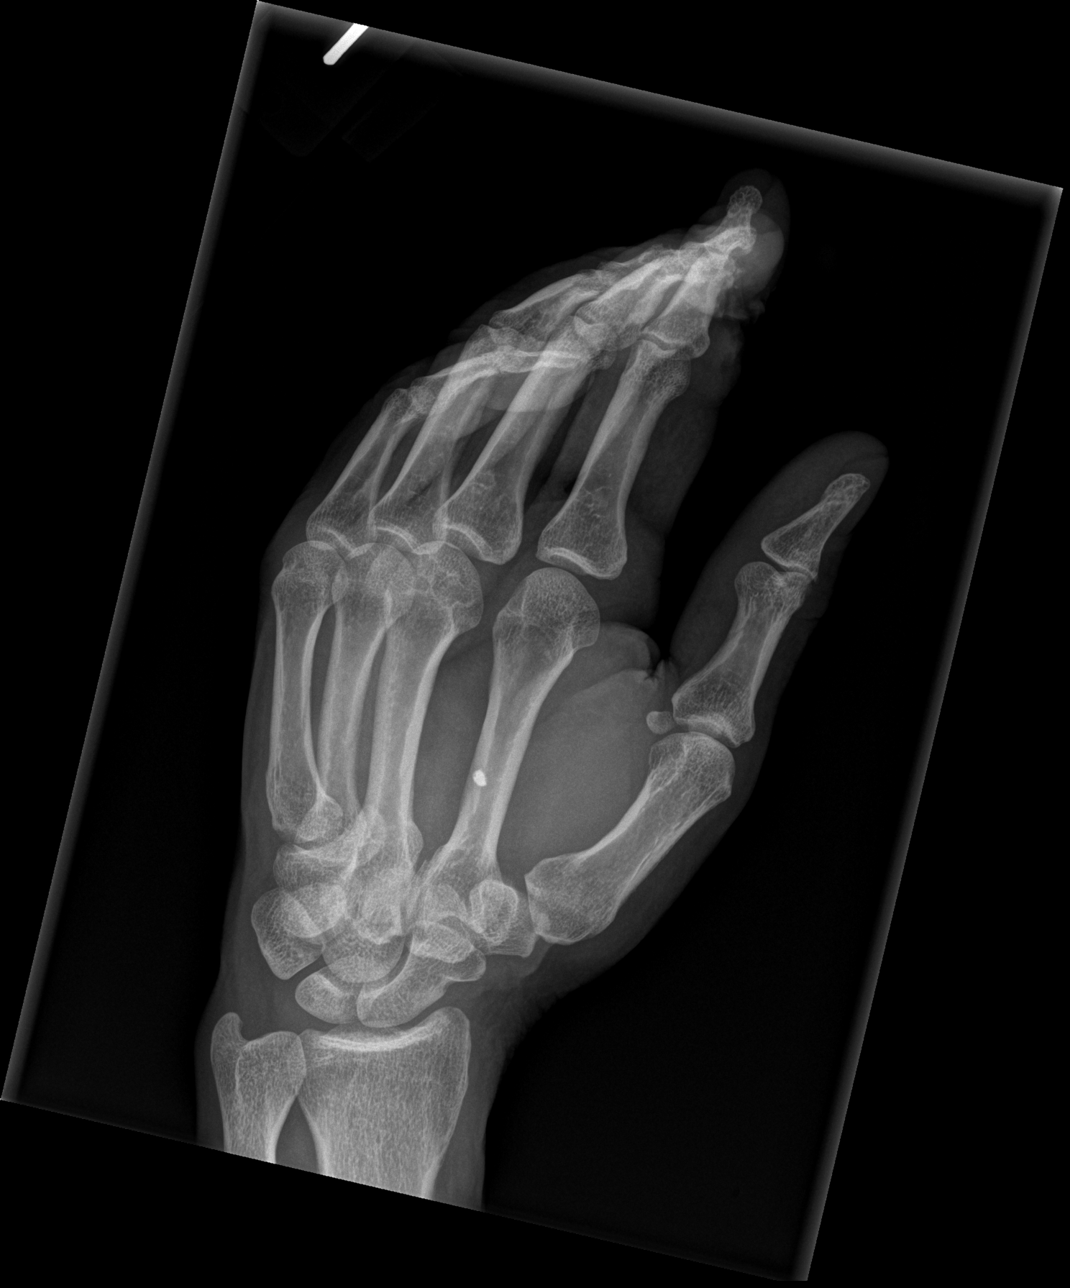

[x hand lat left]
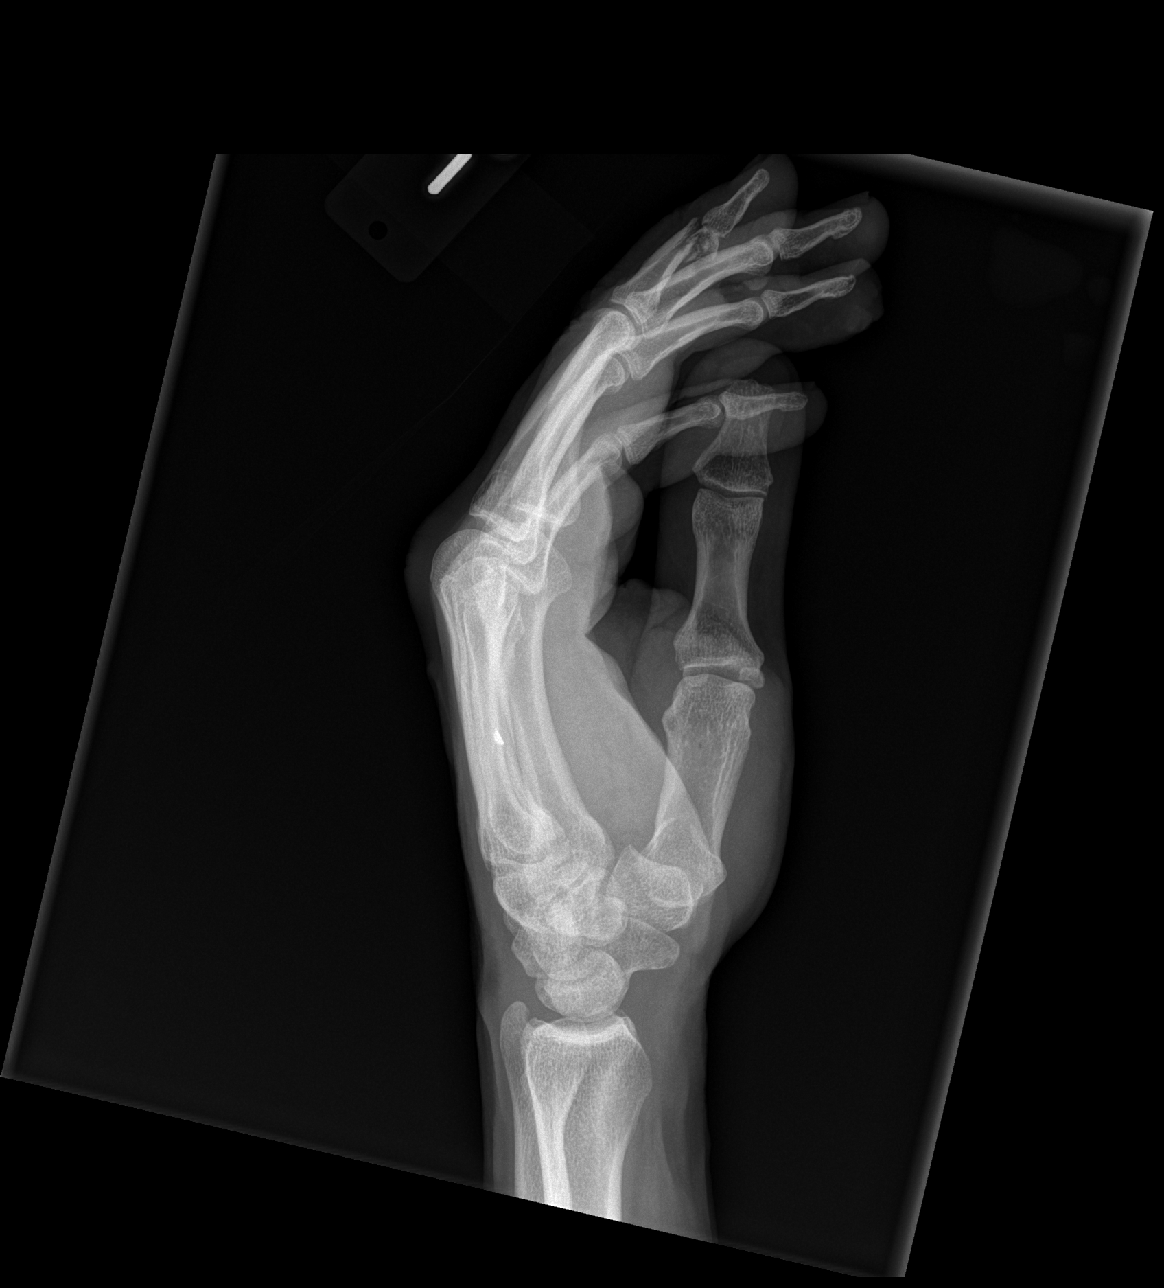

[3 of 3 positions shown; findings below may reference images not displayed]

FINDINGS: Three views study shows a comminuted fracture involving the head of
the index finger middle phalanx with extension into the articular
surface. Overlying prominent soft tissue defect evident. No fracture
identified in the middle, ring, or little fingers. Bones of the from
appear intact. 2 mm radiopaque soft tissue foreign body is
identified anterior to the second metacarpal.
IMPRESSION: 1. Comminuted fracture involving the head of the index finger middle
phalanx.
2. Tiny soft tissue foreign body identified just anterior to the
second metacarpal.

## 2018-08-07 ENCOUNTER — Other Ambulatory Visit: Payer: Self-pay | Admitting: Radiology

## 2018-08-07 DIAGNOSIS — C61 Malignant neoplasm of prostate: Secondary | ICD-10-CM

## 2018-08-11 ENCOUNTER — Other Ambulatory Visit: Payer: Self-pay

## 2018-08-11 ENCOUNTER — Ambulatory Visit
Admission: RE | Admit: 2018-08-11 | Discharge: 2018-08-11 | Disposition: A | Discharge status: Home / Self Care | Payer: Medicare PPO | Source: Ambulatory Visit | Attending: Radiology | Admitting: Radiology

## 2018-08-11 DIAGNOSIS — C61 Malignant neoplasm of prostate: Secondary | ICD-10-CM

## 2018-08-11 MED ORDER — GADOBENATE DIMEGLUMINE 529 MG/ML IV SOLN
17.0000 mL | Freq: Once | INTRAVENOUS | Status: AC | PRN
  Administered 2018-08-11: 17 mL via INTRAVENOUS

## 2019-12-25 ENCOUNTER — Ambulatory Visit: Payer: Self-pay | Admitting: Nurse Practitioner

## 2020-06-02 DIAGNOSIS — Z20822 Contact with and (suspected) exposure to covid-19: Secondary | ICD-10-CM | POA: Diagnosis not present

## 2020-10-11 DIAGNOSIS — I10 Essential (primary) hypertension: Secondary | ICD-10-CM | POA: Diagnosis not present

## 2020-10-11 DIAGNOSIS — Z125 Encounter for screening for malignant neoplasm of prostate: Secondary | ICD-10-CM | POA: Diagnosis not present

## 2020-10-11 DIAGNOSIS — C61 Malignant neoplasm of prostate: Secondary | ICD-10-CM | POA: Diagnosis not present

## 2020-10-11 DIAGNOSIS — Z113 Encounter for screening for infections with a predominantly sexual mode of transmission: Secondary | ICD-10-CM | POA: Diagnosis not present

## 2020-10-14 DIAGNOSIS — Z113 Encounter for screening for infections with a predominantly sexual mode of transmission: Secondary | ICD-10-CM | POA: Diagnosis not present

## 2020-10-21 DIAGNOSIS — I6523 Occlusion and stenosis of bilateral carotid arteries: Secondary | ICD-10-CM | POA: Diagnosis not present

## 2020-10-21 DIAGNOSIS — M7989 Other specified soft tissue disorders: Secondary | ICD-10-CM | POA: Diagnosis not present

## 2020-12-30 DIAGNOSIS — M25511 Pain in right shoulder: Secondary | ICD-10-CM | POA: Diagnosis not present

## 2020-12-30 DIAGNOSIS — M25512 Pain in left shoulder: Secondary | ICD-10-CM | POA: Diagnosis not present

## 2021-01-15 DIAGNOSIS — Z20822 Contact with and (suspected) exposure to covid-19: Secondary | ICD-10-CM | POA: Diagnosis not present

## 2021-02-13 DIAGNOSIS — M25512 Pain in left shoulder: Secondary | ICD-10-CM | POA: Diagnosis not present

## 2021-02-13 DIAGNOSIS — M25511 Pain in right shoulder: Secondary | ICD-10-CM | POA: Diagnosis not present

## 2021-02-17 DIAGNOSIS — C61 Malignant neoplasm of prostate: Secondary | ICD-10-CM | POA: Diagnosis not present

## 2021-02-26 DIAGNOSIS — M25511 Pain in right shoulder: Secondary | ICD-10-CM | POA: Diagnosis not present

## 2021-03-01 DIAGNOSIS — M25511 Pain in right shoulder: Secondary | ICD-10-CM | POA: Diagnosis not present

## 2021-03-01 DIAGNOSIS — M25512 Pain in left shoulder: Secondary | ICD-10-CM | POA: Diagnosis not present

## 2021-07-05 DIAGNOSIS — C61 Malignant neoplasm of prostate: Secondary | ICD-10-CM | POA: Diagnosis not present

## 2021-08-02 DIAGNOSIS — C61 Malignant neoplasm of prostate: Secondary | ICD-10-CM | POA: Diagnosis not present

## 2021-08-07 DIAGNOSIS — C61 Malignant neoplasm of prostate: Secondary | ICD-10-CM | POA: Diagnosis not present

## 2021-08-10 ENCOUNTER — Other Ambulatory Visit: Payer: Self-pay

## 2021-08-10 ENCOUNTER — Emergency Department (HOSPITAL_COMMUNITY)
Admission: EM | Admit: 2021-08-10 | Discharge: 2021-08-10 | Disposition: A | Discharge status: Home / Self Care | Payer: Medicare (Managed Care) | Attending: Student | Admitting: Student

## 2021-08-10 ENCOUNTER — Emergency Department (HOSPITAL_COMMUNITY): Payer: Medicare (Managed Care)

## 2021-08-10 ENCOUNTER — Encounter (HOSPITAL_COMMUNITY): Payer: Self-pay

## 2021-08-10 DIAGNOSIS — R002 Palpitations: Secondary | ICD-10-CM | POA: Insufficient documentation

## 2021-08-10 DIAGNOSIS — R112 Nausea with vomiting, unspecified: Secondary | ICD-10-CM | POA: Diagnosis not present

## 2021-08-10 DIAGNOSIS — R079 Chest pain, unspecified: Secondary | ICD-10-CM | POA: Diagnosis not present

## 2021-08-10 DIAGNOSIS — Z8546 Personal history of malignant neoplasm of prostate: Secondary | ICD-10-CM | POA: Diagnosis not present

## 2021-08-10 DIAGNOSIS — I10 Essential (primary) hypertension: Secondary | ICD-10-CM | POA: Insufficient documentation

## 2021-08-10 DIAGNOSIS — I4891 Unspecified atrial fibrillation: Secondary | ICD-10-CM

## 2021-08-10 DIAGNOSIS — E876 Hypokalemia: Secondary | ICD-10-CM

## 2021-08-10 HISTORY — DX: Essential (primary) hypertension: I10

## 2021-08-10 HISTORY — DX: Malignant neoplasm of prostate: C61

## 2021-08-10 LAB — POTASSIUM
Potassium: 2.6 mmol/L — CL (ref 3.5–5.1)
Potassium: 3.4 mmol/L — ABNORMAL LOW (ref 3.5–5.1)

## 2021-08-10 LAB — CBC
HCT: 40.7 % (ref 39.0–52.0)
Hemoglobin: 13.9 g/dL (ref 13.0–17.0)
MCH: 29.8 pg (ref 26.0–34.0)
MCHC: 34.2 g/dL (ref 30.0–36.0)
MCV: 87.3 fL (ref 80.0–100.0)
Platelets: 214 10*3/uL (ref 150–400)
RBC: 4.66 MIL/uL (ref 4.22–5.81)
RDW: 12.1 % (ref 11.5–15.5)
WBC: 6.9 10*3/uL (ref 4.0–10.5)
nRBC: 0 % (ref 0.0–0.2)

## 2021-08-10 LAB — TROPONIN I (HIGH SENSITIVITY)
Troponin I (High Sensitivity): 13 ng/L (ref <18)
Troponin I (High Sensitivity): 27 ng/L — ABNORMAL HIGH (ref <18)

## 2021-08-10 LAB — BASIC METABOLIC PANEL
Anion gap: 13 (ref 5–15)
BUN: 22 mg/dL (ref 8–23)
CO2: 23 mmol/L (ref 22–32)
Calcium: 8.3 mg/dL — ABNORMAL LOW (ref 8.9–10.3)
Chloride: 97 mmol/L — ABNORMAL LOW (ref 98–111)
Creatinine, Ser: 0.84 mg/dL (ref 0.61–1.24)
GFR, Estimated: >60 mL/min (ref >60)
Glucose, Bld: 116 mg/dL — ABNORMAL HIGH (ref 70–99)
Potassium: 2.6 mmol/L — CL (ref 3.5–5.1)
Sodium: 133 mmol/L — ABNORMAL LOW (ref 135–145)

## 2021-08-10 MED ORDER — POTASSIUM CHLORIDE ER 10 MEQ PO TBCR: 20.0000 meq | EXTENDED_RELEASE_TABLET | Freq: Every day | ORAL | 0 refills | Status: DC

## 2021-08-10 MED ORDER — POTASSIUM CHLORIDE 10 MEQ/100ML IV SOLN
10.0000 meq | INTRAVENOUS | Status: AC
  Administered 2021-08-10 (×2): 10 meq via INTRAVENOUS
  Filled 2021-08-10 (×2): qty 100

## 2021-08-10 MED ORDER — POTASSIUM CHLORIDE CRYS ER 20 MEQ PO TBCR
40.0000 meq | EXTENDED_RELEASE_TABLET | Freq: Once | ORAL | Status: AC
Start: 2021-08-10 — End: 2021-08-10
  Administered 2021-08-10: 40 meq via ORAL
  Filled 2021-08-10: qty 2

## 2021-08-10 MED ORDER — MAGNESIUM OXIDE -MG SUPPLEMENT 400 (240 MG) MG PO TABS
800.0000 mg | ORAL_TABLET | Freq: Once | ORAL | Status: AC
  Administered 2021-08-10: 800 mg via ORAL
  Filled 2021-08-10: qty 2

## 2021-08-10 NOTE — ED Triage Notes (Signed)
Pt presents to ED via [facility]. EMS called out for chest pain and rapid heart rate. Pt found to be in a fib with RVR. Pt was given 20 of cardizem IV. Pt had a drop in systolic BP from 498'Y to 100's given 334m NS bolus. Last BP 139/76 HR 96. Pt denies chest pain at this time.  ?

## 2021-08-10 NOTE — ED Provider Notes (Signed)
?[facility] ?Provider Note ? ?CSN: [id number] ?Arrival date & time: 08/10/21 1704 ? ?Chief Complaint(s) ?Chest Pain ? ?HPI ?Raymond Vega is a 71 y.o. male with PMH HTN, GERD who presents emergency department for evaluation of palpitations.  Chief complaint states chest pain but patient with no complaints of chest pain on my evaluation.  Patient states that over the last 2 to 3 weeks he was recently switched to a combination of valsartan-HCTZ and 48 hours ago had a significant bout of emesis likely related to a "stomach bug" going around his entire family.  He states over the course of yesterday he was feeling weak and today he suffered acute onset palpitations.  EMS was called and he was found to have an irregular heartbeat concerning for A-fib with RVR.  On transport, patient was given 20 mg of diltiazem and patient converted to normal sinus rhythm.  On my evaluation, he is resting comfortably, asymptomatic and normal sinus rhythm with a heart rate of 62.  Denies chest pain, shortness of breath, current abdominal pain, current nausea or vomiting or other systemic symptoms. ? ? ?Chest Pain ?Associated symptoms: nausea, palpitations and vomiting   ? ?Past Medical History ?Past Medical History:  ?Diagnosis Date  ? GERD (gastroesophageal reflux disease)   ? Hypertension   ? Prostate cancer ([place])   ? ?There are no problems to display for this patient. ? ?Home Medication(s) ?Prior to Admission medications   ?Medication Sig Start Date End Date Taking? Authorizing Provider  ?calcium carbonate (CALCIUM 600) 600 MG TABS tablet Take 600 mg by mouth daily.    [provider]  ?HYDROcodone-acetaminophen (NORCO) 5-325 MG tablet Take 1-2 tablets by mouth every 6 (six) hours as needed for moderate pain. ?Patient not taking: Reported on 12/24/2016 11/18/15   Loni Dolly, PA-C  ?ibuprofen (ADVIL,MOTRIN) 800 MG tablet Take 400-800 mg by mouth every 8 (eight) hours as needed (pain).    [provider]  ?Omega-3 Fatty Acids (FISH OIL PO) Take 1 capsule by mouth daily.    [provider]  ?omeprazole (PRILOSEC) 20 MG capsule Take 20 mg by mouth daily.    [provider]  ?oxyCODONE-acetaminophen (ROXICET) 5-325 MG tablet Take 1 tablet by mouth every 4 (four) hours as needed for severe pain. 12/24/16   Charlotte Crumb, MD  ?                                                                                                                                  ?Past Surgical History ?Past Surgical History:  ?Procedure Laterality Date  ? BAND HEMORRHOIDECTOMY    ? CIRCUMCISION    ? FRACTURE SURGERY    ? NASAL FRACTURE SURGERY    ? NERVE AND ARTERY REPAIR Left 12/24/2016  ? Procedure: EXPLORATION WITH REPAIRS AS NECESSARY LACERATION HAND;  Surgeon: Charlotte Crumb, MD;  Location: [facility];  Service: Orthopedics;  Laterality: Left;  ? OLECRANON  BURSECTOMY Right 11/18/2015  ? Procedure: RIGHT ELBOW BURSECTOMY AND OLECRANON SPUR EXCISION;  Surgeon: Melrose Nakayama, MD;  Location: [facility];  Service: Orthopedics;  Laterality: Right;  ? ?Family History ?No family history on file. ? ?Social History ?Social History  ? ?Tobacco Use  ? Smoking status: Never  ? Smokeless tobacco: Never  ?Vaping Use  ? Vaping Use: Never used  ?Substance Use Topics  ? Alcohol use: Yes  ?  Comment: rarely  ? Drug use: No  ? ?Allergies ?Patient has no known allergies. ? ?Review of Systems ?Review of Systems  ?Cardiovascular:  Positive for palpitations. Negative for chest pain.  ?Gastrointestinal:  Positive for nausea and vomiting.  ? ?Physical Exam ?Vital Signs  ?I have reviewed the triage vital signs ?BP 127/76   Pulse 62   Temp (!) 97.5 ?F (36.4 ?C) (Oral)   Resp 14   Ht '5\' 11"'$  (1.803 m)   Wt 80.7 kg   SpO2 98%   BMI 24.83 kg/m?  ? ?Physical Exam ?Vitals and nursing note reviewed.  ?Constitutional:   ?   General: He is not in acute distress. ?   Appearance: He is well-developed.  ?HENT:  ?   Head:  Normocephalic and atraumatic.  ?Eyes:  ?   Conjunctiva/sclera: Conjunctivae normal.  ?Cardiovascular:  ?   Rate and Rhythm: Normal rate and regular rhythm.  ?   Heart sounds: No murmur heard. ?Pulmonary:  ?   Effort: Pulmonary effort is normal. No respiratory distress.  ?   Breath sounds: Normal breath sounds.  ?Abdominal:  ?   Palpations: Abdomen is soft.  ?   Tenderness: There is no abdominal tenderness.  ?Musculoskeletal:     ?   General: No swelling.  ?   Cervical back: Neck supple.  ?Skin: ?   General: Skin is warm and dry.  ?   Capillary Refill: Capillary refill takes less than 2 seconds.  ?Neurological:  ?   Mental Status: He is alert.  ?Psychiatric:     ?   Mood and Affect: Mood normal.  ? ? ?ED Results and Treatments ?Labs ?(all labs ordered are listed, but only abnormal results are displayed) ?Labs Reviewed  ?BASIC METABOLIC PANEL - Abnormal; Notable for the following components:  ?    Result Value  ? Sodium 133 (*)   ? Potassium 2.6 (*)   ? Chloride 97 (*)   ? Glucose, Bld 116 (*)   ? Calcium 8.3 (*)   ? All other components within normal limits  ?CBC  ?TROPONIN I (HIGH SENSITIVITY)  ?TROPONIN I (HIGH SENSITIVITY)  ?                                                                                                                       ? ?Radiology ?DG Chest 2 View ? ?Result Date: 08/10/2021 ?CLINICAL DATA:  Chest pain, rapid heart rate EXAM: CHEST - 2 VIEW COMPARISON:  09/17/2005 FINDINGS: Cardiac and mediastinal contours are within  normal limits. Aortic atherosclerosis. No focal pulmonary opacity. No pleural effusion or pneumothorax. No acute osseous abnormality. IMPRESSION: No acute cardiopulmonary process. Electronically Signed   By: Merilyn Baba M.D.   On: 08/10/2021 18:02   ? ?Pertinent labs & imaging results that were available during my care of the patient were reviewed by me and considered in my medical decision making (see MDM for details). ? ?Medications Ordered in ED ?Medications  ?potassium  chloride SA (KLOR-CON M) CR tablet 40 mEq (has no administration in time range)  ?potassium chloride 10 mEq in 100 mL IVPB (has no administration in time range)  ?magnesium oxide (MAG-OX) tablet 800 mg (has no administration in time range)  ?                                                               ?                                                                    ?Procedures ?[facility] KitchenCritical Care ?Performed by: Teressa Lower, MD ?Authorized by: Teressa Lower, MD  ? ?Critical care provider statement:  ?  Critical care time (minutes):  30 ?  Critical care was necessary to treat or prevent imminent or life-threatening deterioration of the following conditions:  Metabolic crisis ?  Critical care was time spent personally by me on the following activities:  Development of treatment plan with patient or surrogate, discussions with consultants, evaluation of patient's response to treatment, examination of patient, ordering and review of laboratory studies, ordering and review of radiographic studies, ordering and performing treatments and interventions, pulse oximetry, re-evaluation of patient's condition and review of old charts ? ?(including critical care time) ? ?Medical Decision Making / ED Course ? ? ?This patient presents to the ED for concern of A-fib with RVR, this involves an extensive number of treatment options, and is a complaint that carries with it a high risk of complications and morbidity.  The differential diagnosis includes atrial fibrillation, a flutter, electrolyte abnormality, dehydration, medication side effect ? ?MDM: ?Patient seen emergency room for evaluation of new onset palpitations and concern for A-fib with RVR.  Physical exam with a regular rhythm and no tachycardia.  Physical exam unremarkable.  Initial ECG with normal sinus rhythm.  Chest x-ray unremarkable.  Initial potassium 2.6, initial troponin 27 likely demand ischemia from his palpitations and A-fib with RVR that has since  resolved.  I consulted cardiology as his story is well explainable in the setting of his medication induced and persistent vomiting induced hypokalemia likely causing new onset A-fib with RVR that has since been well controlled wit

## 2021-08-10 NOTE — ED Notes (Signed)
Lab at bedside

## 2021-08-10 NOTE — ED Notes (Signed)
CRITICAL VALUE STICKER ? ?CRITICAL VALUE: K+ 2.6 ? ?RECEIVER (on-site recipient of call):Raymond Vega ? ?DATE & TIME NOTIFIED: 08/10/2021 1840 ? ?MESSENGER (representative from lab):lab ? ?MD NOTIFIED: [place] ? ?TIME OF NOTIFICATION:1840 ? ?RESPONSE:   ?

## 2021-08-10 NOTE — ED Notes (Signed)
Patient given bag lunch and ginger ale ?

## 2021-08-28 NOTE — Progress Notes (Signed)
?  ?Cardiology Office Note ? ? ?Date:  08/30/2021  ? ?ID:  Raymond Vega, DOB 1950/09/22, MRN [id number] ? ?PCP:  System, Provider Not In  ?Cardiologist:   None ?Referring:  ED ? ?Chief Complaint  ?Patient presents with  ? Atrial Fibrillation  ? ? ?  ?History of Present Illness: ?Raymond Vega is a 71 y.o. male who presents for evaluation of chest pain.  He was seen in the ED on late March. I reviewed these records for this visit.    He had palpitations.  He called EMS and was found to be in fib with RVR.  He was given Cardizem via EMS.   He apparently converted to NSR before arrival to the ED.   He was very hypokalemic in the ED and was supplemented.    Of note I was able to retrieve the EMS records and confirmed the fibrillation on the EKG.  The rate was in the 150s.  He did have some ventricular ectopy.  There was some upsloping ST depressions. ? ?He said that the day before this happened he was taking care of a sick grandchild and he got sick himself.  He was throwing up all night.  He did not sleep well.  The next day he did too much and his heart started racing.  He said that the only time he is ever had any problems before was in 2001 he had some rapid heart rate.  Otherwise he denies any symptoms.  He exercises routinely and can do vigorous work. The patient denies any new symptoms such as chest discomfort, neck or arm discomfort. There has been no new shortness of breath, PND or orthopnea. There have been no reported palpitations, presyncope or syncope.  ? ? ?Past Medical History:  ?Diagnosis Date  ? GERD (gastroesophageal reflux disease)   ? Hypertension   ? Prostate cancer ([place])   ? ? ?Past Surgical History:  ?Procedure Laterality Date  ? BAND HEMORRHOIDECTOMY    ? CIRCUMCISION    ? FRACTURE SURGERY    ? NASAL FRACTURE SURGERY    ? NERVE AND ARTERY REPAIR Left 12/24/2016  ? Procedure: EXPLORATION WITH REPAIRS AS NECESSARY LACERATION HAND;  Surgeon: Charlotte Crumb, MD;  Location: [facility];  Service:  Orthopedics;  Laterality: Left;  ? OLECRANON BURSECTOMY Right 11/18/2015  ? Procedure: RIGHT ELBOW BURSECTOMY AND OLECRANON SPUR EXCISION;  Surgeon: Melrose Nakayama, MD;  Location: [facility];  Service: Orthopedics;  Laterality: Right;  ? ? ? ?Current Outpatient Medications  ?Medication Sig Dispense Refill  ? aspirin EC 81 MG tablet Take 81 mg by mouth daily. Swallow whole.    ? calcium carbonate (OS-CAL) 600 MG TABS tablet Take 600 mg by mouth daily.    ? Omega-3 Fatty Acids (FISH OIL PO) Take 1 capsule by mouth daily.    ? omeprazole (PRILOSEC) 20 MG capsule Take 20 mg by mouth daily.    ? valsartan-hydrochlorothiazide (DIOVAN-HCT) 320-25 MG tablet Take 1 tablet by mouth daily.    ? HYDROcodone-acetaminophen (NORCO) 5-325 MG tablet Take 1-2 tablets by mouth every 6 (six) hours as needed for moderate pain. (Patient not taking: Reported on 12/24/2016) 30 tablet 0  ? ibuprofen (ADVIL,MOTRIN) 800 MG tablet Take 400-800 mg by mouth every 8 (eight) hours as needed (pain). (Patient not taking: Reported on 08/30/2021)    ? oxyCODONE-acetaminophen (ROXICET) 5-325 MG tablet Take 1 tablet by mouth every 4 (four) hours as needed for severe pain. (Patient not taking: Reported on 08/30/2021)  20 tablet 0  ? potassium chloride (KLOR-CON) 10 MEQ tablet Take 2 tablets (20 mEq total) by mouth daily for 14 days. (Patient not taking: Reported on 08/30/2021) 28 tablet 0  ? ?No current facility-administered medications for this visit.  ? ? ?Allergies:   Patient has no known allergies.  ? ? ?Social History:  The patient  reports that he has never smoked. He has never used smokeless tobacco. He reports current alcohol use. He reports that he does not use drugs.  ? ?Family History:  The patient's family history includes Heart attack (age of onset: 70) in his brother; Pneumonia in his father.  ? ? ?ROS:  Please see the history of present illness.   Otherwise, review of systems are positive for none.   All other systems are reviewed  and negative.  ? ? ?PHYSICAL EXAM: ?VS:  BP 138/78   Pulse 76   Ht '5\' 11"'$  (1.803 m)   Wt 175 lb (79.4 kg)   BMI 24.41 kg/m?  , BMI Body mass index is 24.41 kg/m?. ?GENERAL:  Well appearing ?HEENT:  Pupils equal round and reactive, fundi not visualized, oral mucosa unremarkable ?NECK:  No jugular venous distention, waveform within normal limits, carotid upstroke brisk and symmetric, no bruits, no thyromegaly ?LYMPHATICS:  No cervical, inguinal adenopathy ?LUNGS:  Clear to auscultation bilaterally ?BACK:  No CVA tenderness ?CHEST:  Unremarkable ?HEART:  PMI not displaced or sustained,S1 and S2 within normal limits, no S3, no S4, no clicks, no rubs, no murmurs ?ABD:  Flat, positive bowel sounds normal in frequency in pitch, no bruits, no rebound, no guarding, no midline pulsatile mass, no hepatomegaly, no splenomegaly ?EXT:  2 plus pulses throughout, no edema, no cyanosis no clubbing ?SKIN:  No rashes no nodules ?NEURO:  Cranial nerves II through XII grossly intact, motor grossly intact throughout ?PSYCH:  Cognitively intact, oriented to person place and time ? ? ? ?EKG:  EKG is ordered today. ?The ekg ordered today demonstrates sinus rhythm, rate 72, premature atrial contractions, no acute ST-T wave changes. ? ? ?Recent Labs: ?08/10/2021: BUN 22; Creatinine, Ser 0.84; Hemoglobin 13.9; Platelets 214; Potassium 3.4; Sodium 133  ? ? ?Lipid Panel ?No results found for: CHOL, TRIG, HDL, CHOLHDL, VLDL, LDLCALC, LDLDIRECT ?  ? ?Wt Readings from Last 3 Encounters:  ?08/30/21 175 lb (79.4 kg)  ?08/10/21 178 lb (80.7 kg)  ?12/24/16 180 lb (81.6 kg)  ?  ? ? ?Other studies Reviewed: ?Additional studies/ records that were reviewed today include: ED records. ?Review of the above records demonstrates:  Please see elsewhere in the note.   ? ? ?ASSESSMENT AND PLAN: ? ?PAF: I do think his PAF was likely related to his hypokalemia related to nausea and vomiting.  I would like him to have a repeat basic metabolic profile.  However, do  not think further imaging is indicated.  He and I had a long discussion about this. ? ?HYPOKALEMIA: This will be repeated as above. ? ?CHEST PAIN: There was some vague chest pain.  With pretest probability of obstructive coronary disease I think is low but given his brother as a risk factor I would like to screen him with a coronary calcium score. ? ?PACs: He has asymptomatic PACs.  No change in therapy. ? ? ? ?Current medicines are reviewed at length with the patient today.  The patient does not have concerns regarding medicines. ? ?The following changes have been made:  no change ? ?Labs/ tests ordered today include:  ? ?  Orders Placed This Encounter  ?Procedures  ? CT CARDIAC SCORING (SELF PAY ONLY)  ? Basic metabolic panel  ? Lipid panel  ? EKG 12-Lead  ? ? ? ?Disposition:   FU with me as needed ? ? ?Signed, ?Minus Breeding, MD  ?08/30/2021 3:57 PM    ?[place] ? ? ? ?

## 2021-08-30 ENCOUNTER — Other Ambulatory Visit: Payer: No Typology Code available for payment source

## 2021-08-30 ENCOUNTER — Ambulatory Visit (INDEPENDENT_AMBULATORY_CARE_PROVIDER_SITE_OTHER): Payer: No Typology Code available for payment source | Admitting: Cardiology

## 2021-08-30 ENCOUNTER — Encounter: Payer: Self-pay | Admitting: Cardiology

## 2021-08-30 ENCOUNTER — Other Ambulatory Visit: Payer: Self-pay | Admitting: *Deleted

## 2021-08-30 VITALS — BP 138/78 | HR 76 | Ht 71.0 in | Wt 175.0 lb

## 2021-08-30 DIAGNOSIS — Z79899 Other long term (current) drug therapy: Secondary | ICD-10-CM

## 2021-08-30 DIAGNOSIS — E876 Hypokalemia: Secondary | ICD-10-CM

## 2021-08-30 DIAGNOSIS — I480 Paroxysmal atrial fibrillation: Secondary | ICD-10-CM | POA: Diagnosis not present

## 2021-08-30 DIAGNOSIS — R079 Chest pain, unspecified: Secondary | ICD-10-CM

## 2021-08-30 NOTE — Patient Instructions (Signed)
Medication Instructions:  ?The current medical regimen is effective;  continue present plan and medications. ? ?*If you need a refill on your cardiac medications before your next appointment, please call your pharmacy* ? ?Lab Work: ?Please have blood work at [facility] (Lipid, BMP) ? ?If you have labs (blood work) drawn today and your tests are completely normal, you will receive your results only by: ?MyChart Message (if you have MyChart) OR ?A paper copy in the mail ?If you have any lab test that is abnormal or we need to change your treatment, we will call you to review the results. ? ?Testing/Procedures: ?Your physician has requested that you have a Coronary Calcium score which is completed by CT. Cardiac computed tomography (CT) is a painless test that uses an x-ray machine to take clear, detailed pictures of your heart. There are no instructions for this testing.  You may eat/drink and take your normal medications this day.  The cost of the testing is $99 due at the time of your appointment. ? ?Follow-Up: ?At [facility], you and your health needs are our priority.  As part of our continuing mission to provide you with exceptional heart care, we have created designated Provider Care Teams.  These Care Teams include your primary Cardiologist (physician) and Advanced Practice Providers (APPs -  Physician Assistants and Nurse Practitioners) who all work together to provide you with the care you need, when you need it. ? ?We recommend signing up for the patient portal called "MyChart".  Sign up information is provided on this After Visit Summary.  MyChart is used to connect with patients for Virtual Visits (Telemedicine).  Patients are able to view lab/test results, encounter notes, upcoming appointments, etc.  Non-urgent messages can be sent to your provider as well.   ?To learn more about what you can do with MyChart, go to [email or web address].   ? ? ?Follow up as needed after the above  testing. ? ? ? ?Important Information About Sugar ? ? ? ? ?  ?

## 2021-08-31 ENCOUNTER — Other Ambulatory Visit: Payer: No Typology Code available for payment source

## 2021-08-31 DIAGNOSIS — I480 Paroxysmal atrial fibrillation: Secondary | ICD-10-CM | POA: Diagnosis not present

## 2021-08-31 DIAGNOSIS — E876 Hypokalemia: Secondary | ICD-10-CM | POA: Diagnosis not present

## 2021-08-31 DIAGNOSIS — Z79899 Other long term (current) drug therapy: Secondary | ICD-10-CM | POA: Diagnosis not present

## 2021-09-01 LAB — BASIC METABOLIC PANEL
BUN/Creatinine Ratio: 16 (ref 10–24)
BUN: 15 mg/dL (ref 8–27)
CO2: 26 mmol/L (ref 20–29)
Calcium: 9.3 mg/dL (ref 8.6–10.2)
Chloride: 101 mmol/L (ref 96–106)
Creatinine, Ser: 0.95 mg/dL (ref 0.76–1.27)
Glucose: 94 mg/dL (ref 70–99)
Potassium: 4 mmol/L (ref 3.5–5.2)
Sodium: 141 mmol/L (ref 134–144)
eGFR: 87 mL/min/{1.73_m2} (ref >59)

## 2021-09-01 LAB — LIPID PANEL
Chol/HDL Ratio: 3.8 ratio (ref 0.0–5.0)
Cholesterol, Total: 203 mg/dL — ABNORMAL HIGH (ref 100–199)
HDL: 53 mg/dL (ref >39)
LDL Chol Calc (NIH): 139 mg/dL — ABNORMAL HIGH (ref 0–99)
Triglycerides: 62 mg/dL (ref 0–149)
VLDL Cholesterol Cal: 11 mg/dL (ref 5–40)

## 2021-09-04 ENCOUNTER — Encounter: Payer: Self-pay | Admitting: *Deleted

## 2021-09-13 ENCOUNTER — Encounter: Payer: Self-pay | Admitting: Family Medicine

## 2021-09-13 ENCOUNTER — Ambulatory Visit (INDEPENDENT_AMBULATORY_CARE_PROVIDER_SITE_OTHER): Payer: No Typology Code available for payment source | Admitting: Family Medicine

## 2021-09-13 DIAGNOSIS — I10 Essential (primary) hypertension: Secondary | ICD-10-CM

## 2021-09-13 DIAGNOSIS — E782 Mixed hyperlipidemia: Secondary | ICD-10-CM | POA: Diagnosis not present

## 2021-09-13 DIAGNOSIS — I4891 Unspecified atrial fibrillation: Secondary | ICD-10-CM | POA: Diagnosis not present

## 2021-09-13 MED ORDER — VALSARTAN-HYDROCHLOROTHIAZIDE 320-25 MG PO TABS: 1.0000 | ORAL_TABLET | Freq: Every day | ORAL | 2 refills | Status: DC

## 2021-09-13 MED ORDER — ROSUVASTATIN CALCIUM 10 MG PO TABS: 10.0000 mg | ORAL_TABLET | Freq: Every day | ORAL | 1 refills | Status: DC

## 2021-09-13 MED ORDER — OMEPRAZOLE 20 MG PO CPDR: 20.0000 mg | DELAYED_RELEASE_CAPSULE | Freq: Every day | ORAL | 2 refills | Status: DC

## 2021-09-13 NOTE — Progress Notes (Signed)
? ?Subjective:  ?Patient ID: Raymond Vega, male    DOB: 1950-08-11  Age: 71 y.o. MRN: [id number] ? ?CC: Establish Care ? ? ?HPI ?Raymond Vega presents for new patient evaluation. Recent episode of atrial fibrillation. Possibly secondary to electrolyte disturbance brought on by gastroenteritis and severe vomiting.  ? ? in for follow-up of elevated cholesterol. Doing well without complaints on current medication. Denies side effects of statin including myalgia and arthralgia and nausea. Currently no chest pain, shortness of breath or other cardiovascular related symptoms noted. ? ? presents for  follow-up of hypertension. Patient has no history of headache chest pain or shortness of breath or recent cough. Patient also denies symptoms of TIA such as focal numbness or weakness. Patient denies side effects from medication. States taking it regularly. ? ? ? ?  09/13/2021  ? 11:07 AM  ?Depression screen PHQ 2/9  ?Decreased Interest 0  ?Down, Depressed, Hopeless 0  ?PHQ - 2 Score 0  ?Altered sleeping 0  ?Tired, decreased energy 1  ?Change in appetite 0  ?Feeling bad or failure about yourself  0  ?Trouble concentrating 0  ?Moving slowly or fidgety/restless 0  ?Suicidal thoughts 0  ?PHQ-9 Score 1  ?Difficult doing work/chores Not difficult at all  ? ? ?History ?Raymond Vega has a past medical history of GERD (gastroesophageal reflux disease), Hypertension, and Prostate cancer ([place]).  ? ?He has a past surgical history that includes Nasal fracture surgery; Fracture surgery; Circumcision; Band hemorrhoidectomy; Olecranon bursectomy (Right, 11/18/2015); Nerve and artery repair (Left, 12/24/2016); THUMB SURGERY (Left); and LEFT FINGER SURGERY (Left).  ? ?His family history includes Heart attack (age of onset: 90) in his brother; Pneumonia in his father.He reports that he has never smoked. He has never used smokeless tobacco. He reports current alcohol use. He reports that he does not use drugs. ? ? ? ?ROS ?Review of Systems   ?Constitutional: Negative.   ?HENT: Negative.    ?Eyes:  Negative for visual disturbance.  ?Respiratory:  Negative for cough and shortness of breath.   ?Cardiovascular:  Negative for chest pain and leg swelling.  ?Gastrointestinal:  Negative for abdominal pain, diarrhea, nausea and vomiting.  ?Genitourinary:  Negative for difficulty urinating.  ?Musculoskeletal:  Negative for arthralgias and myalgias.  ?Skin:  Negative for rash.  ?Neurological:  Negative for headaches.  ?Psychiatric/Behavioral:  Negative for sleep disturbance.   ? ?Objective:  ?There were no vitals taken for this visit. ? ?BP Readings from Last 3 Encounters:  ?08/30/21 138/78  ?08/10/21 135/80  ?12/24/16 (!) 153/87  ? ? ?Wt Readings from Last 3 Encounters:  ?08/30/21 175 lb (79.4 kg)  ?08/10/21 178 lb (80.7 kg)  ?12/24/16 180 lb (81.6 kg)  ? ? ? ?Physical Exam ?Constitutional:   ?   General: He is not in acute distress. ?   Appearance: He is well-developed.  ?HENT:  ?   Head: Normocephalic and atraumatic.  ?   Right Ear: External ear normal.  ?   Left Ear: External ear normal.  ?   Nose: Nose normal.  ?Eyes:  ?   Conjunctiva/sclera: Conjunctivae normal.  ?   Pupils: Pupils are equal, round, and reactive to light.  ?Cardiovascular:  ?   Rate and Rhythm: Normal rate and regular rhythm.  ?   Heart sounds: Normal heart sounds. No murmur heard. ?Pulmonary:  ?   Effort: Pulmonary effort is normal. No respiratory distress.  ?   Breath sounds: Normal breath sounds. No wheezing or rales.  ?Abdominal:  ?  Palpations: Abdomen is soft.  ?   Tenderness: There is no abdominal tenderness.  ?Musculoskeletal:     ?   General: Normal range of motion.  ?   Cervical back: Normal range of motion and neck supple.  ?Skin: ?   General: Skin is warm and dry.  ?Neurological:  ?   Mental Status: He is alert and oriented to person, place, and time.  ?   Deep Tendon Reflexes: Reflexes are normal and symmetric.  ?Psychiatric:     ?   Behavior: Behavior normal.     ?   Thought  Content: Thought content normal.     ?   Judgment: Judgment normal.  ? ? ? ? ?Assessment & Plan:  ? ?Raymond Vega was seen today for establish care. ? ?Diagnoses and all orders for this visit: ? ?Mixed hyperlipidemia ?-     Lipid panel; Future ?-     CMP14+EGFR; Future ? ?Primary hypertension ?-     Lipid panel; Future ?-     CMP14+EGFR; Future ? ?Atrial fibrillation, unspecified type ([place]) ? ?Other orders ?-     valsartan-hydrochlorothiazide (DIOVAN-HCT) 320-25 MG tablet; Take 1 tablet by mouth daily. ?-     omeprazole (PRILOSEC) 20 MG capsule; Take 1 capsule (20 mg total) by mouth daily. ?-     rosuvastatin (CRESTOR) 10 MG tablet; Take 1 tablet (10 mg total) by mouth daily. For cholesterol ? ? ? ? ? ? ?I have discontinued Nadir Silliman's HYDROcodone-acetaminophen, ibuprofen, oxyCODONE-acetaminophen, and potassium chloride. I have also changed his omeprazole. Additionally, I am having him start on rosuvastatin. Lastly, I am having him maintain his calcium carbonate, Omega-3 Fatty Acids (FISH OIL PO), aspirin EC, and valsartan-hydrochlorothiazide. ? ?Allergies as of 09/13/2021   ?No Known Allergies ?  ? ?  ?Medication List  ?  ? ?  ? Accurate as of Sep 13, 2021  9:30 PM. If you have any questions, ask your nurse or doctor.  ?  ?  ? ?  ? ?STOP taking these medications   ? ?HYDROcodone-acetaminophen 5-325 MG tablet ?Commonly known as: Norco ?Stopped by: Claretta Fraise, MD ?  ?ibuprofen 800 MG tablet ?Commonly known as: ADVIL ?Stopped by: Claretta Fraise, MD ?  ?oxyCODONE-acetaminophen 5-325 MG tablet ?Commonly known as: Roxicet ?Stopped by: Claretta Fraise, MD ?  ?potassium chloride 10 MEQ tablet ?Commonly known as: KLOR-CON ?Stopped by: Claretta Fraise, MD ?  ? ?  ? ?TAKE these medications   ? ?aspirin EC 81 MG tablet ?Take 81 mg by mouth daily. Swallow whole. ?  ?calcium carbonate 600 MG Tabs tablet ?Commonly known as: OS-CAL ?Take 600 mg by mouth daily. ?  ?FISH OIL PO ?Take 1 capsule by mouth daily. ?  ?omeprazole 20 MG  capsule ?Commonly known as: PRILOSEC ?Take 1 capsule (20 mg total) by mouth daily. ?  ?rosuvastatin 10 MG tablet ?Commonly known as: Crestor ?Take 1 tablet (10 mg total) by mouth daily. For cholesterol ?Started by: Claretta Fraise, MD ?  ?valsartan-hydrochlorothiazide 320-25 MG tablet ?Commonly known as: DIOVAN-HCT ?Take 1 tablet by mouth daily. ?  ? ?  ? ? ? ?Follow-up: Return in about 6 months (around 03/16/2022). ? ?Claretta Fraise, M.D. ?

## 2021-09-20 ENCOUNTER — Ambulatory Visit: Payer: Medicare (Managed Care) | Admitting: Cardiology

## 2021-11-06 ENCOUNTER — Ambulatory Visit (INDEPENDENT_AMBULATORY_CARE_PROVIDER_SITE_OTHER): Payer: No Typology Code available for payment source | Admitting: Family Medicine

## 2021-11-06 ENCOUNTER — Encounter: Payer: Self-pay | Admitting: Family Medicine

## 2021-11-06 VITALS — BP 139/66 | HR 72 | Temp 98.0°F | Ht 71.0 in | Wt 175.4 lb

## 2021-11-06 DIAGNOSIS — N401 Enlarged prostate with lower urinary tract symptoms: Secondary | ICD-10-CM | POA: Diagnosis not present

## 2021-11-06 DIAGNOSIS — I4891 Unspecified atrial fibrillation: Secondary | ICD-10-CM

## 2021-11-06 DIAGNOSIS — I10 Essential (primary) hypertension: Secondary | ICD-10-CM | POA: Diagnosis not present

## 2021-11-06 DIAGNOSIS — R350 Frequency of micturition: Secondary | ICD-10-CM

## 2021-11-06 DIAGNOSIS — E782 Mixed hyperlipidemia: Secondary | ICD-10-CM

## 2021-11-06 DIAGNOSIS — N529 Male erectile dysfunction, unspecified: Secondary | ICD-10-CM | POA: Diagnosis not present

## 2021-11-06 MED ORDER — TADALAFIL 5 MG PO TABS: 5.0000 mg | ORAL_TABLET | Freq: Every day | ORAL | 3 refills | Status: DC | PRN

## 2021-11-06 NOTE — Progress Notes (Signed)
Subjective:  Patient ID: Raymond Vega, male    DOB: 02/22/1951  Age: 71 y.o. MRN: [id number]  CC: Follow-up   HPI Raymond Vega presents for  follow-up of hypertension. Patient has no history of headache chest pain or shortness of breath or recent cough. Patient also denies symptoms of TIA such as focal numbness or weakness. Patient denies side effects from medication. States taking it regularly.   in for follow-up of elevated cholesterol. Doing well without complaints on current medication, crestor. Denies side effects of statin including myalgia and arthralgia and nausea. Currently no chest pain, shortness of breath or other cardiovascular related symptoms noted.  Concerned about bone density as a result of long-term use of PPI. Takes calcium.   Having urinary frequency and urgency, chronic. Wants something to help. Also has some E.D. loses erection during sex.     History Kimberly has a past medical history of GERD (gastroesophageal reflux disease), Hypertension, and Prostate cancer ([facility]).   Raymond Vega has a past surgical history that includes Nasal fracture surgery; Fracture surgery; Circumcision; Band hemorrhoidectomy; Olecranon bursectomy (Right, 11/18/2015); Nerve and artery repair (Left, 12/24/2016); THUMB SURGERY (Left); and LEFT FINGER SURGERY (Left).   His family history includes Heart attack (age of onset: 37) in his brother; Pneumonia in his father.Raymond Vega reports that Raymond Vega has never smoked. Raymond Vega has never used smokeless tobacco. Raymond Vega reports current alcohol use. Raymond Vega reports that Raymond Vega does not use drugs.  Current Outpatient Medications on File Prior to Visit  Medication Sig Dispense Refill   aspirin EC 81 MG tablet Take 81 mg by mouth daily. Swallow whole.     calcium carbonate (OS-CAL) 600 MG TABS tablet Take 600 mg by mouth daily.     Omega-3 Fatty Acids (FISH OIL PO) Take 1 capsule by mouth daily.     omeprazole (PRILOSEC) 20 MG capsule Take 1 capsule (20 mg total) by mouth daily. 100  capsule 2   rosuvastatin (CRESTOR) 10 MG tablet Take 1 tablet (10 mg total) by mouth daily. For cholesterol 100 tablet 1   valsartan-hydrochlorothiazide (DIOVAN-HCT) 320-25 MG tablet Take 1 tablet by mouth daily. 100 tablet 2   No current facility-administered medications on file prior to visit.    ROS Review of Systems  Constitutional:  Negative for fever.  Respiratory:  Negative for shortness of breath.   Cardiovascular:  Negative for chest pain.  Genitourinary:  Positive for frequency and urgency. Negative for difficulty urinating and dysuria.  Musculoskeletal:  Negative for arthralgias.  Skin:  Negative for rash.    Objective:  BP 139/66   Pulse 72   Temp 98 F (36.7 C)   Ht 5\' 11"  (1.803 m)   Wt 175 lb 6.4 oz (79.6 kg)   SpO2 98%   BMI 24.46 kg/m   BP Readings from Last 3 Encounters:  11/06/21 139/66  08/30/21 138/78  08/10/21 135/80    Wt Readings from Last 3 Encounters:  11/06/21 175 lb 6.4 oz (79.6 kg)  08/30/21 175 lb (79.4 kg)  08/10/21 178 lb (80.7 kg)     Physical Exam Vitals reviewed.  Constitutional:      Appearance: Raymond Vega is well-developed.  HENT:     Head: Normocephalic and atraumatic.     Right Ear: External ear normal.     Left Ear: External ear normal.     Mouth/Throat:     Pharynx: No oropharyngeal exudate or posterior oropharyngeal erythema.  Eyes:     Pupils: Pupils are equal, round, and reactive to  light.  Cardiovascular:     Rate and Rhythm: Normal rate and regular rhythm.     Heart sounds: No murmur heard. Pulmonary:     Effort: No respiratory distress.     Breath sounds: Normal breath sounds.  Musculoskeletal:     Cervical back: Normal range of motion and neck supple.  Neurological:     Mental Status: Raymond Vega is alert and oriented to person, place, and time.       Assessment & Plan:   Samyog was seen today for follow-up.  Diagnoses and all orders for this visit:  Primary hypertension  Atrial fibrillation, unspecified type  ([facility])  Mixed hyperlipidemia  Erectile dysfunction, unspecified erectile dysfunction type  Benign prostatic hyperplasia with urinary frequency  Other orders -     tadalafil (CIALIS) 5 MG tablet; Take 1 tablet (5 mg total) by mouth daily as needed for erectile dysfunction.   Allergies as of 11/06/2021   No Known Allergies      Medication List        Accurate as of November 06, 2021  2:36 PM. If you have any questions, ask your nurse or doctor.          aspirin EC 81 MG tablet Take 81 mg by mouth daily. Swallow whole.   calcium carbonate 600 MG Tabs tablet Commonly known as: OS-CAL Take 600 mg by mouth daily.   FISH OIL PO Take 1 capsule by mouth daily.   omeprazole 20 MG capsule Commonly known as: PRILOSEC Take 1 capsule (20 mg total) by mouth daily.   rosuvastatin 10 MG tablet Commonly known as: Crestor Take 1 tablet (10 mg total) by mouth daily. For cholesterol   tadalafil 5 MG tablet Commonly known as: CIALIS Take 1 tablet (5 mg total) by mouth daily as needed for erectile dysfunction. Started by: Mechele Claude, MD   valsartan-hydrochlorothiazide 320-25 MG tablet Commonly known as: DIOVAN-HCT Take 1 tablet by mouth daily.        Meds ordered this encounter  Medications   tadalafil (CIALIS) 5 MG tablet    Sig: Take 1 tablet (5 mg total) by mouth daily as needed for erectile dysfunction.    Dispense:  90 tablet    Refill:  3      Follow-up: Return in about 6 months (around 05/08/2022).  Mechele Claude, M.D.

## 2021-11-20 ENCOUNTER — Inpatient Hospital Stay: Admission: RE | Admit: 2021-11-20 | Payer: No Typology Code available for payment source | Source: Ambulatory Visit

## 2021-11-21 ENCOUNTER — Other Ambulatory Visit: Payer: Self-pay

## 2021-11-21 ENCOUNTER — Telehealth: Payer: Self-pay | Admitting: Family Medicine

## 2021-11-21 DIAGNOSIS — N401 Enlarged prostate with lower urinary tract symptoms: Secondary | ICD-10-CM

## 2021-11-21 NOTE — Telephone Encounter (Signed)
Future PSA ordered. Pt made aware.

## 2021-11-21 NOTE — Telephone Encounter (Signed)
Would like to add a PSA to lab work. Please call back to let him know when this is done.

## 2021-11-22 ENCOUNTER — Other Ambulatory Visit: Payer: No Typology Code available for payment source

## 2021-11-22 DIAGNOSIS — N401 Enlarged prostate with lower urinary tract symptoms: Secondary | ICD-10-CM | POA: Diagnosis not present

## 2021-11-22 DIAGNOSIS — E782 Mixed hyperlipidemia: Secondary | ICD-10-CM | POA: Diagnosis not present

## 2021-11-22 DIAGNOSIS — I10 Essential (primary) hypertension: Secondary | ICD-10-CM | POA: Diagnosis not present

## 2021-11-22 DIAGNOSIS — R350 Frequency of micturition: Secondary | ICD-10-CM | POA: Diagnosis not present

## 2021-11-23 ENCOUNTER — Encounter: Payer: Self-pay | Admitting: Family Medicine

## 2021-11-23 LAB — CMP14+EGFR
ALT: 17 IU/L (ref 0–44)
AST: 21 IU/L (ref 0–40)
Albumin/Globulin Ratio: 1.8 (ref 1.2–2.2)
Albumin: 4.3 g/dL (ref 3.9–4.9)
Alkaline Phosphatase: 57 IU/L (ref 44–121)
BUN/Creatinine Ratio: 13 (ref 10–24)
BUN: 13 mg/dL (ref 8–27)
Bilirubin Total: 0.9 mg/dL (ref 0.0–1.2)
CO2: 25 mmol/L (ref 20–29)
Calcium: 9.1 mg/dL (ref 8.6–10.2)
Chloride: 97 mmol/L (ref 96–106)
Creatinine, Ser: 1.03 mg/dL (ref 0.76–1.27)
Globulin, Total: 2.4 g/dL (ref 1.5–4.5)
Glucose: 93 mg/dL (ref 70–99)
Potassium: 3.5 mmol/L (ref 3.5–5.2)
Sodium: 137 mmol/L (ref 134–144)
Total Protein: 6.7 g/dL (ref 6.0–8.5)
eGFR: 79 mL/min/{1.73_m2} (ref >59)

## 2021-11-23 LAB — PSA, TOTAL AND FREE
PSA, Free Pct: 13.4 %
PSA, Free: 1.03 ng/mL
Prostate Specific Ag, Serum: 7.7 ng/mL — ABNORMAL HIGH (ref 0.0–4.0)

## 2021-11-23 LAB — LIPID PANEL
Chol/HDL Ratio: 2.7 ratio (ref 0.0–5.0)
Cholesterol, Total: 136 mg/dL (ref 100–199)
HDL: 50 mg/dL (ref >39)
LDL Chol Calc (NIH): 71 mg/dL (ref 0–99)
Triglycerides: 78 mg/dL (ref 0–149)
VLDL Cholesterol Cal: 15 mg/dL (ref 5–40)

## 2021-11-28 DIAGNOSIS — N529 Male erectile dysfunction, unspecified: Secondary | ICD-10-CM | POA: Diagnosis not present

## 2021-11-28 DIAGNOSIS — C61 Malignant neoplasm of prostate: Secondary | ICD-10-CM | POA: Diagnosis not present

## 2022-01-12 ENCOUNTER — Other Ambulatory Visit: Payer: No Typology Code available for payment source

## 2022-01-16 ENCOUNTER — Other Ambulatory Visit: Payer: No Typology Code available for payment source

## 2022-01-16 DIAGNOSIS — C61 Malignant neoplasm of prostate: Secondary | ICD-10-CM | POA: Diagnosis not present

## 2022-01-30 DIAGNOSIS — C61 Malignant neoplasm of prostate: Secondary | ICD-10-CM | POA: Diagnosis not present

## 2022-02-23 DIAGNOSIS — N4289 Other specified disorders of prostate: Secondary | ICD-10-CM | POA: Diagnosis not present

## 2022-02-23 DIAGNOSIS — C61 Malignant neoplasm of prostate: Secondary | ICD-10-CM | POA: Diagnosis not present

## 2022-02-23 DIAGNOSIS — I700 Atherosclerosis of aorta: Secondary | ICD-10-CM | POA: Diagnosis not present

## 2022-03-14 DIAGNOSIS — C61 Malignant neoplasm of prostate: Secondary | ICD-10-CM | POA: Diagnosis not present

## 2022-03-20 ENCOUNTER — Ambulatory Visit (INDEPENDENT_AMBULATORY_CARE_PROVIDER_SITE_OTHER): Payer: No Typology Code available for payment source | Admitting: Nurse Practitioner

## 2022-03-20 ENCOUNTER — Encounter: Payer: Self-pay | Admitting: Nurse Practitioner

## 2022-03-20 DIAGNOSIS — J069 Acute upper respiratory infection, unspecified: Secondary | ICD-10-CM

## 2022-03-20 MED ORDER — PROMETHAZINE-DM 6.25-15 MG/5ML PO SYRP: 5.0000 mL | ORAL_SOLUTION | Freq: Four times a day (QID) | ORAL | 0 refills | Status: DC | PRN

## 2022-03-20 MED ORDER — AMOXICILLIN-POT CLAVULANATE 875-125 MG PO TABS: 1.0000 | ORAL_TABLET | Freq: Two times a day (BID) | ORAL | 0 refills | Status: DC

## 2022-03-20 NOTE — Progress Notes (Signed)
Virtual Visit  Note Due to COVID-19 pandemic this visit was conducted virtually. This visit type was conducted due to national recommendations for restrictions regarding the COVID-19 Pandemic (e.g. social distancing, sheltering in place) in an effort to limit this patient's exposure and mitigate transmission in our community. All issues noted in this document were discussed and addressed.  A physical exam was not performed with this format.  I connected with Alberteen Spindle on 03/20/22 at 10:10 by telephone and verified that I am speaking with the correct person using two identifiers. Kalman Nylen is currently located at home and no one is currently with him during visit. The provider, Mary-Margaret Hassell Done, FNP is located in their office at time of visit.  I discussed the limitations, risks, security and privacy concerns of performing an evaluation and management service by telephone and the availability of in person appointments. I also discussed with the patient that there may be a patient responsible charge related to this service. The patient expressed understanding and agreed to proceed.   History and Present Illness:  URI  The current episode started in the past 7 days. The problem has been gradually worsening. The maximum temperature recorded prior to his arrival was 100.4 - 100.9 F. The fever has been present for Less than 1 day. Associated symptoms include congestion, coughing (has become productive the last 2 days) and headaches. Pertinent negatives include no sneezing or sore throat. Rash: fever just started yesterday.He has tried acetaminophen for the symptoms. The treatment provided mild relief.      Review of Systems  HENT:  Positive for congestion. Negative for sneezing and sore throat.   Respiratory:  Positive for cough (has become productive the last 2 days).   Skin:  Rash: fever just started yesterday.  Neurological:  Positive for headaches.      Observations/Objective: Alert and oriented- answers all questions appropriately No distress Deep dry cough  Assessment and Plan: Haseeb Fiallos in today with chief complaint of URI   1. URI with cough and congestion 1. Take meds as prescribed 2. Use a cool mist humidifier especially during the winter months and when heat has been humid. 3. Use saline nose sprays frequently 4. Saline irrigations of the nose can be very helpful if done frequently.  * 4X daily for 1 week*  * Use of a nettie pot can be helpful with this. Follow directions with this* 5. Drink plenty of fluids 6. Keep thermostat turn down low 7.For any cough or congestion- promethazine DM- sedation precautions 8. For fever or aces or pains- take tylenol or ibuprofen appropriate for age and weight.  * for fevers greater than 101 orally you may alternate ibuprofen and tylenol every  3 hours.    Meds ordered this encounter  Medications   amoxicillin-clavulanate (AUGMENTIN) 875-125 MG tablet    Sig: Take 1 tablet by mouth 2 (two) times daily.    Dispense:  20 tablet    Refill:  0    Order Specific Question:   Supervising Provider    Answer:   Caryl Pina A [[id number]]   promethazine-dextromethorphan (PROMETHAZINE-DM) 6.25-15 MG/5ML syrup    Sig: Take 5 mLs by mouth 4 (four) times daily as needed for cough.    Dispense:  118 mL    Refill:  0    Order Specific Question:   Supervising Provider    Answer:   Caryl Pina A [[id number]]     Follow Up Instructions: prn    I discussed the  assessment and treatment plan with the patient. The patient was provided an opportunity to ask questions and all were answered. The patient agreed with the plan and demonstrated an understanding of the instructions.   The patient was advised to call back or seek an in-person evaluation if the symptoms worsen or if the condition fails to improve as anticipated.  The above assessment and management plan was discussed with  the patient. The patient verbalized understanding of and has agreed to the management plan. Patient is aware to call the clinic if symptoms persist or worsen. Patient is aware when to return to the clinic for a follow-up visit. Patient educated on when it is appropriate to go to the emergency department.   Time call ended:  10:27  I provided 17 minutes of  non face-to-face time during this encounter.    Mary-Margaret Hassell Done, FNP

## 2022-03-20 NOTE — Patient Instructions (Signed)
1. Take meds as prescribed 2. Use a cool mist humidifier especially during the winter months and when heat has been humid. 3. Use saline nose sprays frequently 4. Saline irrigations of the nose can be very helpful if done frequently.  * 4X daily for 1 week*  * Use of a nettie pot can be helpful with this. Follow directions with this* 5. Drink plenty of fluids 6. Keep thermostat turn down low 7.For any cough or congestion- promethazine DM- with sedation precautions 8. For fever or aces or pains- take tylenol or ibuprofen appropriate for age and weight.  * for fevers greater than 101 orally you may alternate ibuprofen and tylenol every  3 hours.

## 2022-03-31 ENCOUNTER — Other Ambulatory Visit: Payer: Self-pay | Admitting: Family Medicine

## 2022-04-02 ENCOUNTER — Ambulatory Visit: Payer: No Typology Code available for payment source | Admitting: Family Medicine

## 2022-04-04 ENCOUNTER — Ambulatory Visit: Payer: No Typology Code available for payment source | Admitting: Nurse Practitioner

## 2022-04-23 ENCOUNTER — Other Ambulatory Visit: Payer: No Typology Code available for payment source

## 2022-04-23 DIAGNOSIS — N318 Other neuromuscular dysfunction of bladder: Secondary | ICD-10-CM | POA: Diagnosis not present

## 2022-04-23 DIAGNOSIS — C61 Malignant neoplasm of prostate: Secondary | ICD-10-CM | POA: Diagnosis not present

## 2022-05-02 ENCOUNTER — Ambulatory Visit: Payer: No Typology Code available for payment source | Admitting: Family Medicine

## 2022-05-02 DIAGNOSIS — I10 Essential (primary) hypertension: Secondary | ICD-10-CM

## 2022-05-02 DIAGNOSIS — E782 Mixed hyperlipidemia: Secondary | ICD-10-CM

## 2022-06-06 ENCOUNTER — Ambulatory Visit (INDEPENDENT_AMBULATORY_CARE_PROVIDER_SITE_OTHER): Payer: Medicare (Managed Care) | Admitting: Family Medicine

## 2022-06-06 ENCOUNTER — Encounter: Payer: Self-pay | Admitting: Family Medicine

## 2022-06-06 VITALS — BP 132/81 | HR 66 | Temp 97.9°F | Ht 71.0 in | Wt 172.0 lb

## 2022-06-06 DIAGNOSIS — E782 Mixed hyperlipidemia: Secondary | ICD-10-CM | POA: Diagnosis not present

## 2022-06-06 DIAGNOSIS — K2100 Gastro-esophageal reflux disease with esophagitis, without bleeding: Secondary | ICD-10-CM | POA: Diagnosis not present

## 2022-06-06 DIAGNOSIS — I10 Essential (primary) hypertension: Secondary | ICD-10-CM | POA: Diagnosis not present

## 2022-06-06 MED ORDER — ROSUVASTATIN CALCIUM 10 MG PO TABS: 10.0000 mg | ORAL_TABLET | Freq: Every day | ORAL | 3 refills | Status: DC

## 2022-06-06 MED ORDER — TADALAFIL 5 MG PO TABS: 5.0000 mg | ORAL_TABLET | Freq: Every day | ORAL | 3 refills | Status: DC

## 2022-06-06 MED ORDER — OMEPRAZOLE 20 MG PO CPDR: 20.0000 mg | DELAYED_RELEASE_CAPSULE | Freq: Every day | ORAL | 2 refills | Status: DC

## 2022-06-06 MED ORDER — VALSARTAN-HYDROCHLOROTHIAZIDE 320-25 MG PO TABS: 1.0000 | ORAL_TABLET | Freq: Every day | ORAL | 3 refills | Status: DC

## 2022-06-06 NOTE — Progress Notes (Signed)
Subjective:  Patient ID: Raymond Vega, male    DOB: [birth year]  Age: 72 y.o. MRN: [id number]  CC: Medical Management of Chronic Issues   HPI Raymond Vega presents for  follow-up of hypertension. Patient has no history of headache chest pain or shortness of breath or recent cough. Patient also denies symptoms of TIA such as focal numbness or weakness. Patient denies side effects from medication. States taking it regularly.   Patient in for follow-up of elevated cholesterol. Doing well without complaints on current medication. Denies side effects of statin including myalgia and arthralgia and nausea. Also in today for liver function testing. Currently no chest pain, shortness of breath or other cardiovascular related symptoms noted.  Patient in for follow-up of GERD. Currently asymptomatic taking  PPI daily. There is no chest pain or heartburn. No hematemesis and no melena. No dysphagia or choking. Onset is remote. Progression is stable. Complicating factors, none. Patient is getting good relief with tadalafil for his nocturia and frequency.  He continues to take this daily.  History Raymond Vega has a past medical history of GERD (gastroesophageal reflux disease), Hypertension, and Prostate cancer ([place]).   He has a past surgical history that includes Nasal fracture surgery; Fracture surgery; Circumcision; Band hemorrhoidectomy; Olecranon bursectomy (Right, 11/18/2015); Nerve and artery repair (Left, 12/24/2016); THUMB SURGERY (Left); and LEFT FINGER SURGERY (Left).   His family history includes Heart attack (age of onset: 16) in his brother; Pneumonia in his father.He reports that he has never smoked. He has never used smokeless tobacco. He reports current alcohol use. He reports that he does not use drugs.  Current Outpatient Medications on File Prior to Visit  Medication Sig Dispense Refill   aspirin EC 81 MG tablet Take 81 mg by mouth daily. Swallow whole.     calcium carbonate (OS-CAL)  600 MG TABS tablet Take 600 mg by mouth daily.     Omega-3 Fatty Acids (FISH OIL PO) Take 1 capsule by mouth daily.     No current facility-administered medications on file prior to visit.    ROS Review of Systems  Constitutional:  Negative for fever.  Respiratory:  Negative for shortness of breath.   Cardiovascular:  Negative for chest pain.  Musculoskeletal:  Negative for arthralgias.  Skin:  Negative for rash.    Objective:  BP 132/81   Pulse 66   Temp 97.9 F (36.6 C)   Ht '5\' 11"'$  (1.803 m)   Wt 172 lb (78 kg)   SpO2 97%   BMI 23.99 kg/m   BP Readings from Last 3 Encounters:  06/06/22 132/81  11/06/21 139/66  08/30/21 138/78    Wt Readings from Last 3 Encounters:  06/06/22 172 lb (78 kg)  11/06/21 175 lb 6.4 oz (79.6 kg)  08/30/21 175 lb (79.4 kg)     Physical Exam Vitals reviewed.  Constitutional:      Appearance: He is well-developed.  HENT:     Head: Normocephalic and atraumatic.     Right Ear: External ear normal.     Left Ear: External ear normal.     Mouth/Throat:     Pharynx: No oropharyngeal exudate or posterior oropharyngeal erythema.  Eyes:     Pupils: Pupils are equal, round, and reactive to light.  Cardiovascular:     Rate and Rhythm: Normal rate and regular rhythm.     Heart sounds: No murmur heard. Pulmonary:     Effort: No respiratory distress.     Breath sounds: Normal breath sounds.  Musculoskeletal:     Cervical back: Normal range of motion and neck supple.  Neurological:     Mental Status: He is alert and oriented to person, place, and time.       Assessment & Plan:   Raymond Vega was seen today for medical management of chronic issues.  Diagnoses and all orders for this visit:  Mixed hyperlipidemia -     CMP14+EGFR; Future -     CBC with Differential/Platelet; Future -     Lipid panel; Future  Primary hypertension -     CMP14+EGFR; Future -     CBC with Differential/Platelet; Future  Gastroesophageal reflux disease with  esophagitis without hemorrhage  Other orders -     omeprazole (PRILOSEC) 20 MG capsule; Take 1 capsule (20 mg total) by mouth daily. -     rosuvastatin (CRESTOR) 10 MG tablet; Take 1 tablet (10 mg total) by mouth daily. for cholesterol -     valsartan-hydrochlorothiazide (DIOVAN-HCT) 320-25 MG tablet; Take 1 tablet by mouth daily. -     tadalafil (CIALIS) 5 MG tablet; Take 1 tablet (5 mg total) by mouth daily.   Allergies as of 06/06/2022   No Known Allergies      Medication List        Accurate as of June 06, 2022  5:52 PM. If you have any questions, ask your nurse or doctor.          STOP taking these medications    promethazine-dextromethorphan 6.25-15 MG/5ML syrup Commonly known as: PROMETHAZINE-DM Stopped by: Claretta Fraise, MD       TAKE these medications    aspirin EC 81 MG tablet Take 81 mg by mouth daily. Swallow whole.   calcium carbonate 600 MG Tabs tablet Commonly known as: OS-CAL Take 600 mg by mouth daily.   FISH OIL PO Take 1 capsule by mouth daily.   omeprazole 20 MG capsule Commonly known as: PRILOSEC Take 1 capsule (20 mg total) by mouth daily.   rosuvastatin 10 MG tablet Commonly known as: CRESTOR Take 1 tablet (10 mg total) by mouth daily. for cholesterol   tadalafil 5 MG tablet Commonly known as: CIALIS Take 1 tablet (5 mg total) by mouth daily. What changed:  when to take this reasons to take this Changed by: Claretta Fraise, MD   valsartan-hydrochlorothiazide 320-25 MG tablet Commonly known as: DIOVAN-HCT Take 1 tablet by mouth daily.        Meds ordered this encounter  Medications   omeprazole (PRILOSEC) 20 MG capsule    Sig: Take 1 capsule (20 mg total) by mouth daily.    Dispense:  90 capsule    Refill:  2   rosuvastatin (CRESTOR) 10 MG tablet    Sig: Take 1 tablet (10 mg total) by mouth daily. for cholesterol    Dispense:  90 tablet    Refill:  3   valsartan-hydrochlorothiazide (DIOVAN-HCT) 320-25 MG tablet     Sig: Take 1 tablet by mouth daily.    Dispense:  90 tablet    Refill:  3   tadalafil (CIALIS) 5 MG tablet    Sig: Take 1 tablet (5 mg total) by mouth daily.    Dispense:  90 tablet    Refill:  3      Follow-up: Return in about 6 months (around 12/05/2022).  Claretta Fraise, M.D.

## 2022-06-13 ENCOUNTER — Other Ambulatory Visit: Payer: Medicare (Managed Care)

## 2022-06-13 DIAGNOSIS — I10 Essential (primary) hypertension: Secondary | ICD-10-CM | POA: Diagnosis not present

## 2022-06-13 DIAGNOSIS — E782 Mixed hyperlipidemia: Secondary | ICD-10-CM

## 2022-06-13 LAB — CMP14+EGFR
ALT: 17 IU/L (ref 0–44)
AST: 17 IU/L (ref 0–40)
Albumin/Globulin Ratio: 1.8 (ref 1.2–2.2)
Albumin: 4.4 g/dL (ref 3.9–4.9)
Alkaline Phosphatase: 59 IU/L (ref 44–121)
BUN/Creatinine Ratio: 19 (ref 10–24)
BUN: 18 mg/dL (ref 8–27)
Bilirubin Total: 0.5 mg/dL (ref 0.0–1.2)
CO2: 27 mmol/L (ref 20–29)
Calcium: 9.2 mg/dL (ref 8.6–10.2)
Chloride: 98 mmol/L (ref 96–106)
Creatinine, Ser: 0.94 mg/dL (ref 0.76–1.27)
Globulin, Total: 2.4 g/dL (ref 1.5–4.5)
Glucose: 100 mg/dL — ABNORMAL HIGH (ref 70–99)
Potassium: 3.7 mmol/L (ref 3.5–5.2)
Sodium: 139 mmol/L (ref 134–144)
Total Protein: 6.8 g/dL (ref 6.0–8.5)
eGFR: 88 mL/min/{1.73_m2} (ref >59)

## 2022-06-13 LAB — CBC WITH DIFFERENTIAL/PLATELET
Basophils Absolute: 0 10*3/uL (ref 0.0–0.2)
Basos: 1 %
EOS (ABSOLUTE): 0.2 10*3/uL (ref 0.0–0.4)
Eos: 3 %
Hematocrit: 42.8 % (ref 37.5–51.0)
Hemoglobin: 13.9 g/dL (ref 13.0–17.7)
Immature Grans (Abs): 0 10*3/uL (ref 0.0–0.1)
Immature Granulocytes: 0 %
Lymphocytes Absolute: 1.2 10*3/uL (ref 0.7–3.1)
Lymphs: 23 %
MCH: 29 pg (ref 26.6–33.0)
MCHC: 32.5 g/dL (ref 31.5–35.7)
MCV: 89 fL (ref 79–97)
Monocytes Absolute: 0.4 10*3/uL (ref 0.1–0.9)
Monocytes: 7 %
Neutrophils Absolute: 3.5 10*3/uL (ref 1.4–7.0)
Neutrophils: 66 %
Platelets: 234 10*3/uL (ref 150–450)
RBC: 4.8 x10E6/uL (ref 4.14–5.80)
RDW: 12.8 % (ref 11.6–15.4)
WBC: 5.3 10*3/uL (ref 3.4–10.8)

## 2022-06-13 LAB — LIPID PANEL
Chol/HDL Ratio: 2.4 ratio (ref 0.0–5.0)
Cholesterol, Total: 125 mg/dL (ref 100–199)
HDL: 52 mg/dL (ref >39)
LDL Chol Calc (NIH): 62 mg/dL (ref 0–99)
Triglycerides: 45 mg/dL (ref 0–149)
VLDL Cholesterol Cal: 11 mg/dL (ref 5–40)

## 2022-06-17 NOTE — Progress Notes (Signed)
Hello Zayveon,  Your lab result is normal and/or stable.Some minor variations that are not significant are commonly marked abnormal, but do not represent any medical problem for you.  Best regards, Claretta Fraise, M.D.

## 2022-07-16 ENCOUNTER — Ambulatory Visit (INDEPENDENT_AMBULATORY_CARE_PROVIDER_SITE_OTHER): Payer: No Typology Code available for payment source

## 2022-07-16 VITALS — Ht 71.0 in | Wt 175.0 lb

## 2022-07-16 DIAGNOSIS — Z0000 Encounter for general adult medical examination without abnormal findings: Secondary | ICD-10-CM

## 2022-07-16 NOTE — Patient Instructions (Signed)
Raymond Vega , Thank you for taking time to come for your Medicare Wellness Visit. I appreciate your ongoing commitment to your health goals. Please review the following plan we discussed and let me know if I can assist you in the future.   These are the goals we discussed:  Goals      DIET - INCREASE WATER INTAKE        This is a list of the screening recommended for you and due dates:  Health Maintenance  Topic Date Due   COVID-19 Vaccine (1) Never done   Colon Cancer Screening  Never done   Flu Shot  08/12/2022*   Zoster (Shingles) Vaccine (1 of 2) 09/05/2022*   Pneumonia Vaccine (1 of 1 - PCV) 09/14/2022*   Hepatitis C Screening: USPSTF Recommendation to screen - Ages 18-79 yo.  09/14/2022*   Medicare Annual Wellness Visit  07/16/2023   DTaP/Tdap/Td vaccine (2 - Td or Tdap) 12/25/2026   HPV Vaccine  Aged Out  *Topic was postponed. The date shown is not the original due date.    Advanced directives: Please bring a copy of your health care power of attorney and living will to the office to be added to your chart at your convenience.   Conditions/risks identified: Aim for 30 minutes of exercise or brisk walking, 6-8 glasses of water, and 5 servings of fruits and vegetables each day.   Next appointment: Follow up in one year for your annual wellness visit.   Preventive Care 2 Years and Older, Male  Preventive care refers to lifestyle choices and visits with your health care provider that can promote health and wellness. What does preventive care include? A yearly physical exam. This is also called an annual well check. Dental exams once or twice a year. Routine eye exams. Ask your health care provider how often you should have your eyes checked. Personal lifestyle choices, including: Daily care of your teeth and gums. Regular physical activity. Eating a healthy diet. Avoiding tobacco and drug use. Limiting alcohol use. Practicing safe sex. Taking low doses of aspirin  every day. Taking vitamin and mineral supplements as recommended by your health care provider. What happens during an annual well check? The services and screenings done by your health care provider during your annual well check will depend on your age, overall health, lifestyle risk factors, and family history of disease. Counseling  Your health care provider may ask you questions about your: Alcohol use. Tobacco use. Drug use. Emotional well-being. Home and relationship well-being. Sexual activity. Eating habits. History of falls. Memory and ability to understand (cognition). Work and work Statistician. Screening  You may have the following tests or measurements: Height, weight, and BMI. Blood pressure. Lipid and cholesterol levels. These may be checked every 5 years, or more frequently if you are over 25 years old. Skin check. Lung cancer screening. You may have this screening every year starting at age 2 if you have a 30-pack-year history of smoking and currently smoke or have quit within the past 15 years. Fecal occult blood test (FOBT) of the stool. You may have this test every year starting at age 6. Flexible sigmoidoscopy or colonoscopy. You may have a sigmoidoscopy every 5 years or a colonoscopy every 10 years starting at age 38. Prostate cancer screening. Recommendations will vary depending on your family history and other risks. Hepatitis C blood test. Hepatitis B blood test. Sexually transmitted disease (STD) testing. Diabetes screening. This is done by checking your blood sugar (  glucose) after you have not eaten for a while (fasting). You may have this done every 1-3 years. Abdominal aortic aneurysm (AAA) screening. You may need this if you are a current or former smoker. Osteoporosis. You may be screened starting at age 32 if you are at high risk. Talk with your health care provider about your test results, treatment options, and if necessary, the need for more  tests. Vaccines  Your health care provider may recommend certain vaccines, such as: Influenza vaccine. This is recommended every year. Tetanus, diphtheria, and acellular pertussis (Tdap, Td) vaccine. You may need a Td booster every 10 years. Zoster vaccine. You may need this after age 25. Pneumococcal 13-valent conjugate (PCV13) vaccine. One dose is recommended after age 63. Pneumococcal polysaccharide (PPSV23) vaccine. One dose is recommended after age 52. Talk to your health care provider about which screenings and vaccines you need and how often you need them. This information is not intended to replace advice given to you by your health care provider. Make sure you discuss any questions you have with your health care provider. Document Released: 05/27/2015 Document Revised: 01/18/2016 Document Reviewed: 03/01/2015 Elsevier Interactive Patient Education  2017 [place] Prevention in the Home Falls can cause injuries. They can happen to people of all ages. There are many things you can do to make your home safe and to help prevent falls. What can I do on the outside of my home? Regularly fix the edges of walkways and driveways and fix any cracks. Remove anything that might make you trip as you walk through a door, such as a raised step or threshold. Trim any bushes or trees on the path to your home. Use bright outdoor lighting. Clear any walking paths of anything that might make someone trip, such as rocks or tools. Regularly check to see if handrails are loose or broken. Make sure that both sides of any steps have handrails. Any raised decks and porches should have guardrails on the edges. Have any leaves, snow, or ice cleared regularly. Use sand or salt on walking paths during winter. Clean up any spills in your garage right away. This includes oil or grease spills. What can I do in the bathroom? Use night lights. Install grab bars by the toilet and in the tub and shower.  Do not use towel bars as grab bars. Use non-skid mats or decals in the tub or shower. If you need to sit down in the shower, use a plastic, non-slip stool. Keep the floor dry. Clean up any water that spills on the floor as soon as it happens. Remove soap buildup in the tub or shower regularly. Attach bath mats securely with double-sided non-slip rug tape. Do not have throw rugs and other things on the floor that can make you trip. What can I do in the bedroom? Use night lights. Make sure that you have a light by your bed that is easy to reach. Do not use any sheets or blankets that are too big for your bed. They should not hang down onto the floor. Have a firm chair that has side arms. You can use this for support while you get dressed. Do not have throw rugs and other things on the floor that can make you trip. What can I do in the kitchen? Clean up any spills right away. Avoid walking on wet floors. Keep items that you use a lot in easy-to-reach places. If you need to reach something above you, use  a strong step stool that has a grab bar. Keep electrical cords out of the way. Do not use floor polish or wax that makes floors slippery. If you must use wax, use non-skid floor wax. Do not have throw rugs and other things on the floor that can make you trip. What can I do with my stairs? Do not leave any items on the stairs. Make sure that there are handrails on both sides of the stairs and use them. Fix handrails that are broken or loose. Make sure that handrails are as long as the stairways. Check any carpeting to make sure that it is firmly attached to the stairs. Fix any carpet that is loose or worn. Avoid having throw rugs at the top or bottom of the stairs. If you do have throw rugs, attach them to the floor with carpet tape. Make sure that you have a light switch at the top of the stairs and the bottom of the stairs. If you do not have them, ask someone to add them for you. What else  can I do to help prevent falls? Wear shoes that: Do not have high heels. Have rubber bottoms. Are comfortable and fit you well. Are closed at the toe. Do not wear sandals. If you use a stepladder: Make sure that it is fully opened. Do not climb a closed stepladder. Make sure that both sides of the stepladder are locked into place. Ask someone to hold it for you, if possible. Clearly mark and make sure that you can see: Any grab bars or handrails. First and last steps. Where the edge of each step is. Use tools that help you move around (mobility aids) if they are needed. These include: Canes. Walkers. Scooters. Crutches. Turn on the lights when you go into a dark area. Replace any light bulbs as soon as they burn out. Set up your furniture so you have a clear path. Avoid moving your furniture around. If any of your floors are uneven, fix them. If there are any pets around you, be aware of where they are. Review your medicines with your doctor. Some medicines can make you feel dizzy. This can increase your chance of falling. Ask your doctor what other things that you can do to help prevent falls. This information is not intended to replace advice given to you by your health care provider. Make sure you discuss any questions you have with your health care provider. Document Released: 02/24/2009 Document Revised: 10/06/2015 Document Reviewed: 06/04/2014 Elsevier Interactive Patient Education  2017 [employer].

## 2022-07-16 NOTE — Progress Notes (Signed)
Subjective:   Raymond Vega is a 72 y.o. male who presents for Medicare Initial preventive examination. I connected with  Raymond Vega on 07/16/22 by a audio enabled telemedicine application and verified that I am speaking with the correct person using two identifiers.  Patient Location: Home  Provider Location: Home Office  I discussed the limitations of evaluation and management by telemedicine. The patient expressed understanding and agreed to proceed.  Review of Systems     Cardiac Risk Factors include: advanced age (>68mn, >>43women);male gender;dyslipidemia     Objective:    Today's Vitals   07/16/22 1351  Weight: 175 lb (79.4 kg)  Height: '5\' 11"'$  (1.803 m)   Body mass index is 24.41 kg/m.     07/16/2022    1:53 PM 08/10/2021    5:15 PM 11/18/2015   12:33 PM 11/10/2015   12:26 PM  Advanced Directives  Does Patient Have a Medical Advance Directive? Yes No Yes Yes  Type of AParamedicof [place]ClaiborneLiving will  [place]St. [place]Living will [place] will  Does patient want to make changes to medical advance directive?   No - Patient declined   Copy of HClutierin Chart? No - copy requested  No - copy requested     Current Medications (verified) Outpatient Encounter Medications as of 07/16/2022  Medication Sig   aspirin EC 81 MG tablet Take 81 mg by mouth daily. Swallow whole.   calcium carbonate (OS-CAL) 600 MG TABS tablet Take 600 mg by mouth daily.   Omega-3 Fatty Acids (FISH OIL PO) Take 1 capsule by mouth daily.   omeprazole (PRILOSEC) 20 MG capsule Take 1 capsule (20 mg total) by mouth daily.   rosuvastatin (CRESTOR) 10 MG tablet Take 1 tablet (10 mg total) by mouth daily. for cholesterol   tadalafil (CIALIS) 5 MG tablet Take 1 tablet (5 mg total) by mouth daily.   valsartan-hydrochlorothiazide (DIOVAN-HCT) 320-25 MG tablet Take 1 tablet by mouth daily.   No facility-administered  encounter medications on file as of 07/16/2022.    Allergies (verified) Patient has no known allergies.   History: Past Medical History:  Diagnosis Date   GERD (gastroesophageal reflux disease)    Hypertension    [place]    Past Surgical History:  Procedure Laterality Date   BAND HEMORRHOIDECTOMY     CIRCUMCISION     FRACTURE SURGERY     LEFT FINGER SURGERY Left    INDEX, MIDDLE, LONG FINGERS   NASAL FRACTURE SURGERY     NERVE AND ARTERY REPAIR Left 12/24/2016   Procedure: EXPLORATION WITH REPAIRS AS NECESSARY LACERATION HAND;  Surgeon: WCharlotte Crumb MD;  [facility]  Service: Orthopedics;  Laterality: Left;   OLECRANON BURSECTOMY Right 11/18/2015   Procedure: RIGHT ELBOW BURSECTOMY AND OLECRANON SPUR EXCISION;  Surgeon: PMelrose Nakayama MD;  [facility]  Service: Orthopedics;  Laterality: Right;   THUMB SURGERY Left    Family History  Problem Relation Age of Onset   Pneumonia Father    Heart attack Brother 659  Social History   Socioeconomic History   Marital status: Divorced    Spouse name: Not on file   Number of children: 3   Years of education: Not on file   Highest education level: Not on file  Occupational History   Occupation: SELF EMPLOYED    Comment: [employer]  Tobacco Use   Smoking status: Never  Smokeless tobacco: Never  Vaping Use   Vaping Use: Never used  Substance and Sexual Activity   Alcohol use: Yes    Comment: rarely   Drug use: No   Sexual activity: Not on file  Other Topics Concern   Not on file  Social History Narrative   Not on file   Social Determinants of Health   Financial Resource Strain: Low Risk  (07/16/2022)   Overall Financial Resource Strain (CARDIA)    Difficulty of Paying Living Expenses: Not hard at all  Food Insecurity: No Food Insecurity (07/16/2022)   Hunger Vital Sign    Worried About Running Out of Food in the Last Year: Never true    [place] in the Last  Year: Never true  Transportation Needs: No Transportation Needs (07/16/2022)   PRAPARE - Hydrologist (Medical): No    Lack of Transportation (Non-Medical): No  Physical Activity: Sufficiently Active (07/16/2022)   Exercise Vital Sign    Days of Exercise per Week: 3 days    Minutes of Exercise per Session: 60 min  Stress: No Stress Concern Present (07/16/2022)   [place]    Feeling of Stress : Not at all  Social Connections: Moderately Isolated (07/16/2022)   Social Connection and Isolation Panel [NHANES]    Frequency of Communication with Friends and Family: More than three times a week    Frequency of Social Gatherings with Friends and Family: More than three times a week    Attends Religious Services: More than 4 times per year    Active Member of [employer] or Organizations: No    Attends Music therapist: Never    Marital Status: Divorced    Tobacco Counseling Counseling given: Not Answered   Clinical Intake:  Pre-visit preparation completed: Yes  Pain : No/denies pain     Nutritional Risks: None Diabetes: No  How often do you need to have someone help you when you read instructions, pamphlets, or other written materials from your doctor or pharmacy?: 1 - Never  Diabetic?no   Interpreter Needed?: No  Information entered by :: Jadene Pierini, LPN   Activities of Daily Living    07/16/2022    1:53 PM  In your present state of health, do you have any difficulty performing the following activities:  Hearing? 0  Vision? 0  Difficulty concentrating or making decisions? 0  Walking or climbing stairs? 0  Dressing or bathing? 0  Doing errands, shopping? 0  Preparing Food and eating ? N  Using the Toilet? N  In the past six months, have you accidently leaked urine? N  Do you have problems with loss of bowel control? N  Managing your Medications? N  Managing your  Finances? N  Housekeeping or managing your Housekeeping? N    Patient Care Team: Claretta Fraise, MD as PCP - General (Family Medicine)  Indicate any recent Medical Services you may have received from other than [facility] providers in the past year (date may be approximate).     Assessment:   This is a routine wellness examination for Raymond Vega.  Hearing/Vision screen Vision Screening - Comments:: Wears rx glasses - up to date with routine eye exams with  Dr.Lee   Dietary issues and exercise activities discussed: Current Exercise Habits: Home exercise routine, Type of exercise: walking, Time (Minutes): 60, Frequency (Times/Week): 3, Weekly Exercise (Minutes/Week): 180, Intensity: Mild, Exercise limited by: None identified  Goals Addressed             This Visit's Progress    DIET - INCREASE WATER INTAKE         Depression Screen    07/16/2022    1:52 PM 06/06/2022    2:30 PM 06/06/2022    2:17 PM 06/06/2022    2:16 PM 11/06/2021    2:17 PM 09/13/2021   11:07 AM  PHQ 2/9 Scores  PHQ - 2 Score 0 1 0 0 1 0  PHQ- 9 Score 0 '2   2 1    '$ Fall Risk    07/16/2022    1:51 PM 06/06/2022    2:17 PM 06/06/2022    2:16 PM 11/06/2021    2:12 PM  Fall Risk   Falls in the past year? 0 0 0 0  Number falls in past yr: 0     Injury with Fall? 0     Risk for fall due to : No Fall Risks History of fall(s)    Follow up Falls prevention discussed Falls evaluation completed      [place]:  Any stairs in or around the home? Yes  If so, are there any without handrails? No  Home free of loose throw rugs in walkways, pet beds, electrical cords, etc? Yes  Adequate lighting in your home to reduce risk of falls? Yes   ASSISTIVE DEVICES UTILIZED TO PREVENT FALLS:  Life alert? No  Use of a cane, walker or w/c? No  Grab bars in the bathroom? No  Shower chair or bench in shower? No  Elevated toilet seat or a handicapped toilet? No        07/16/2022    1:54 PM  6CIT  Screen  What Year? 0 points  What month? 0 points  What time? 0 points  Count back from 20 0 points  Months in reverse 0 points  Repeat phrase 0 points  Total Score 0 points    Immunizations Immunization History  Administered Date(s) Administered   Tdap 12/24/2016    TDAP status: Up to date  Flu Vaccine status: Due, Education has been provided regarding the importance of this vaccine. Advised may receive this vaccine at local pharmacy or Health Dept. Aware to provide a copy of the vaccination record if obtained from local pharmacy or Health Dept. Verbalized acceptance and understanding.  Pneumococcal vaccine status: Due, Education has been provided regarding the importance of this vaccine. Advised may receive this vaccine at local pharmacy or Health Dept. Aware to provide a copy of the vaccination record if obtained from local pharmacy or Health Dept. Verbalized acceptance and understanding.  Covid-19 vaccine status: Completed vaccines  Qualifies for Shingles Vaccine? Yes   Zostavax completed No   Shingrix Completed?: No.    Education has been provided regarding the importance of this vaccine. Patient has been advised to call insurance company to determine out of pocket expense if they have not yet received this vaccine. Advised may also receive vaccine at local pharmacy or Health Dept. Verbalized acceptance and understanding.  Screening Tests Health Maintenance  Topic Date Due   COVID-19 Vaccine (1) Never done   COLONOSCOPY (Pts 45-58yr Insurance coverage will need to be confirmed)  Never done   INFLUENZA VACCINE  08/12/2022 (Originally 12/12/2021)   Zoster Vaccines- Shingrix (1 of 2) 09/05/2022 (Originally 12/07/1969)   Pneumonia Vaccine 70 Years old (1 of 1 - PCV) 09/14/2022 (Originally 12/06/2017)   Hepatitis C  Screening  09/14/2022 (Originally 12/07/1968)   Medicare Annual Wellness (AWV)  07/16/2023   DTaP/Tdap/Td (2 - Td or Tdap) 12/25/2026   HPV VACCINES  Aged Out     Health Maintenance  Health Maintenance Due  Topic Date Due   COVID-19 Vaccine (1) Never done   COLONOSCOPY (Pts 45-20yr Insurance coverage will need to be confirmed)  Never done    Colorectal cancer screening: Referral to GI placed Patient to obtain records . Pt aware the office will call re: appt.  Lung Cancer Screening: (Low Dose CT Chest recommended if Age 70-80years, 30 pack-year currently smoking OR have quit w/in 15years.) does not qualify.   Lung Cancer Screening Referral: n/a  Additional Screening:  Hepatitis C Screening: does not qualify;  Vision Screening: Recommended annual ophthalmology exams for early detection of glaucoma and other disorders of the eye. Is the patient up to date with their annual eye exam?  Yes  Who is the provider or what is the name of the office in which the patient attends annual eye exams? Dr.Lee  If pt is not established with a provider, would they like to be referred to a provider to establish care? No .   Dental Screening: Recommended annual dental exams for proper oral hygiene  Community Resource Referral / Chronic Care Management: CRR required this visit?  No   CCM required this visit?  No      Plan:     I have personally reviewed and noted the following in the patient's chart:   Medical and social history Use of alcohol, tobacco or illicit drugs  Current medications and supplements including opioid prescriptions. Patient is not currently taking opioid prescriptions. Functional ability and status Nutritional status Physical activity Advanced directives List of other physicians Hospitalizations, surgeries, and ER visits in previous 12 months Vitals Screenings to include cognitive, depression, and falls Referrals and appointments  In addition, I have reviewed and discussed with patient certain preventive protocols, quality metrics, and best practice recommendations. A written personalized care plan for preventive  services as well as general preventive health recommendations were provided to patient.     LDaphane Shepherd LPN   [id number]  Nurse Notes: Patient will obtain Colonoscopy records to send to PCP

## 2022-09-03 ENCOUNTER — Telehealth: Payer: Self-pay | Admitting: Family Medicine

## 2022-09-03 NOTE — Telephone Encounter (Signed)
Pt. Needs to be seen for this. Thanks, WS 

## 2022-09-03 NOTE — Telephone Encounter (Signed)
Patient would like to have orders added for his cholesterol and an STD check. Please call back.

## 2022-09-03 NOTE — Telephone Encounter (Signed)
Left message for pt to call back to schedule appt

## 2022-09-12 DIAGNOSIS — M7541 Impingement syndrome of right shoulder: Secondary | ICD-10-CM | POA: Diagnosis not present

## 2022-09-12 DIAGNOSIS — M7542 Impingement syndrome of left shoulder: Secondary | ICD-10-CM | POA: Diagnosis not present

## 2022-10-01 ENCOUNTER — Other Ambulatory Visit: Payer: No Typology Code available for payment source

## 2022-10-01 DIAGNOSIS — N401 Enlarged prostate with lower urinary tract symptoms: Secondary | ICD-10-CM | POA: Diagnosis not present

## 2022-10-01 DIAGNOSIS — R350 Frequency of micturition: Secondary | ICD-10-CM | POA: Diagnosis not present

## 2022-10-01 DIAGNOSIS — Z114 Encounter for screening for human immunodeficiency virus [HIV]: Secondary | ICD-10-CM | POA: Diagnosis not present

## 2022-10-01 DIAGNOSIS — E785 Hyperlipidemia, unspecified: Secondary | ICD-10-CM | POA: Diagnosis not present

## 2022-10-01 DIAGNOSIS — Z113 Encounter for screening for infections with a predominantly sexual mode of transmission: Secondary | ICD-10-CM | POA: Diagnosis not present

## 2022-11-05 ENCOUNTER — Telehealth: Payer: Self-pay | Admitting: Family Medicine

## 2022-11-05 ENCOUNTER — Other Ambulatory Visit: Payer: Self-pay | Admitting: *Deleted

## 2022-11-05 DIAGNOSIS — Z125 Encounter for screening for malignant neoplasm of prostate: Secondary | ICD-10-CM

## 2022-11-05 DIAGNOSIS — E782 Mixed hyperlipidemia: Secondary | ICD-10-CM

## 2022-11-05 DIAGNOSIS — I10 Essential (primary) hypertension: Secondary | ICD-10-CM

## 2022-11-05 NOTE — Telephone Encounter (Signed)
LAB ORDERS ENTERED

## 2022-11-07 DIAGNOSIS — M7542 Impingement syndrome of left shoulder: Secondary | ICD-10-CM | POA: Diagnosis not present

## 2022-11-19 IMAGING — DX DG CHEST 2V
2 series · 2 of 2 positions shown · non-contrast
Comparison: 09/17/2005

CLINICAL DATA: Chest pain, rapid heart rate

EXAM:
CHEST - 2 VIEW

[chest pa]
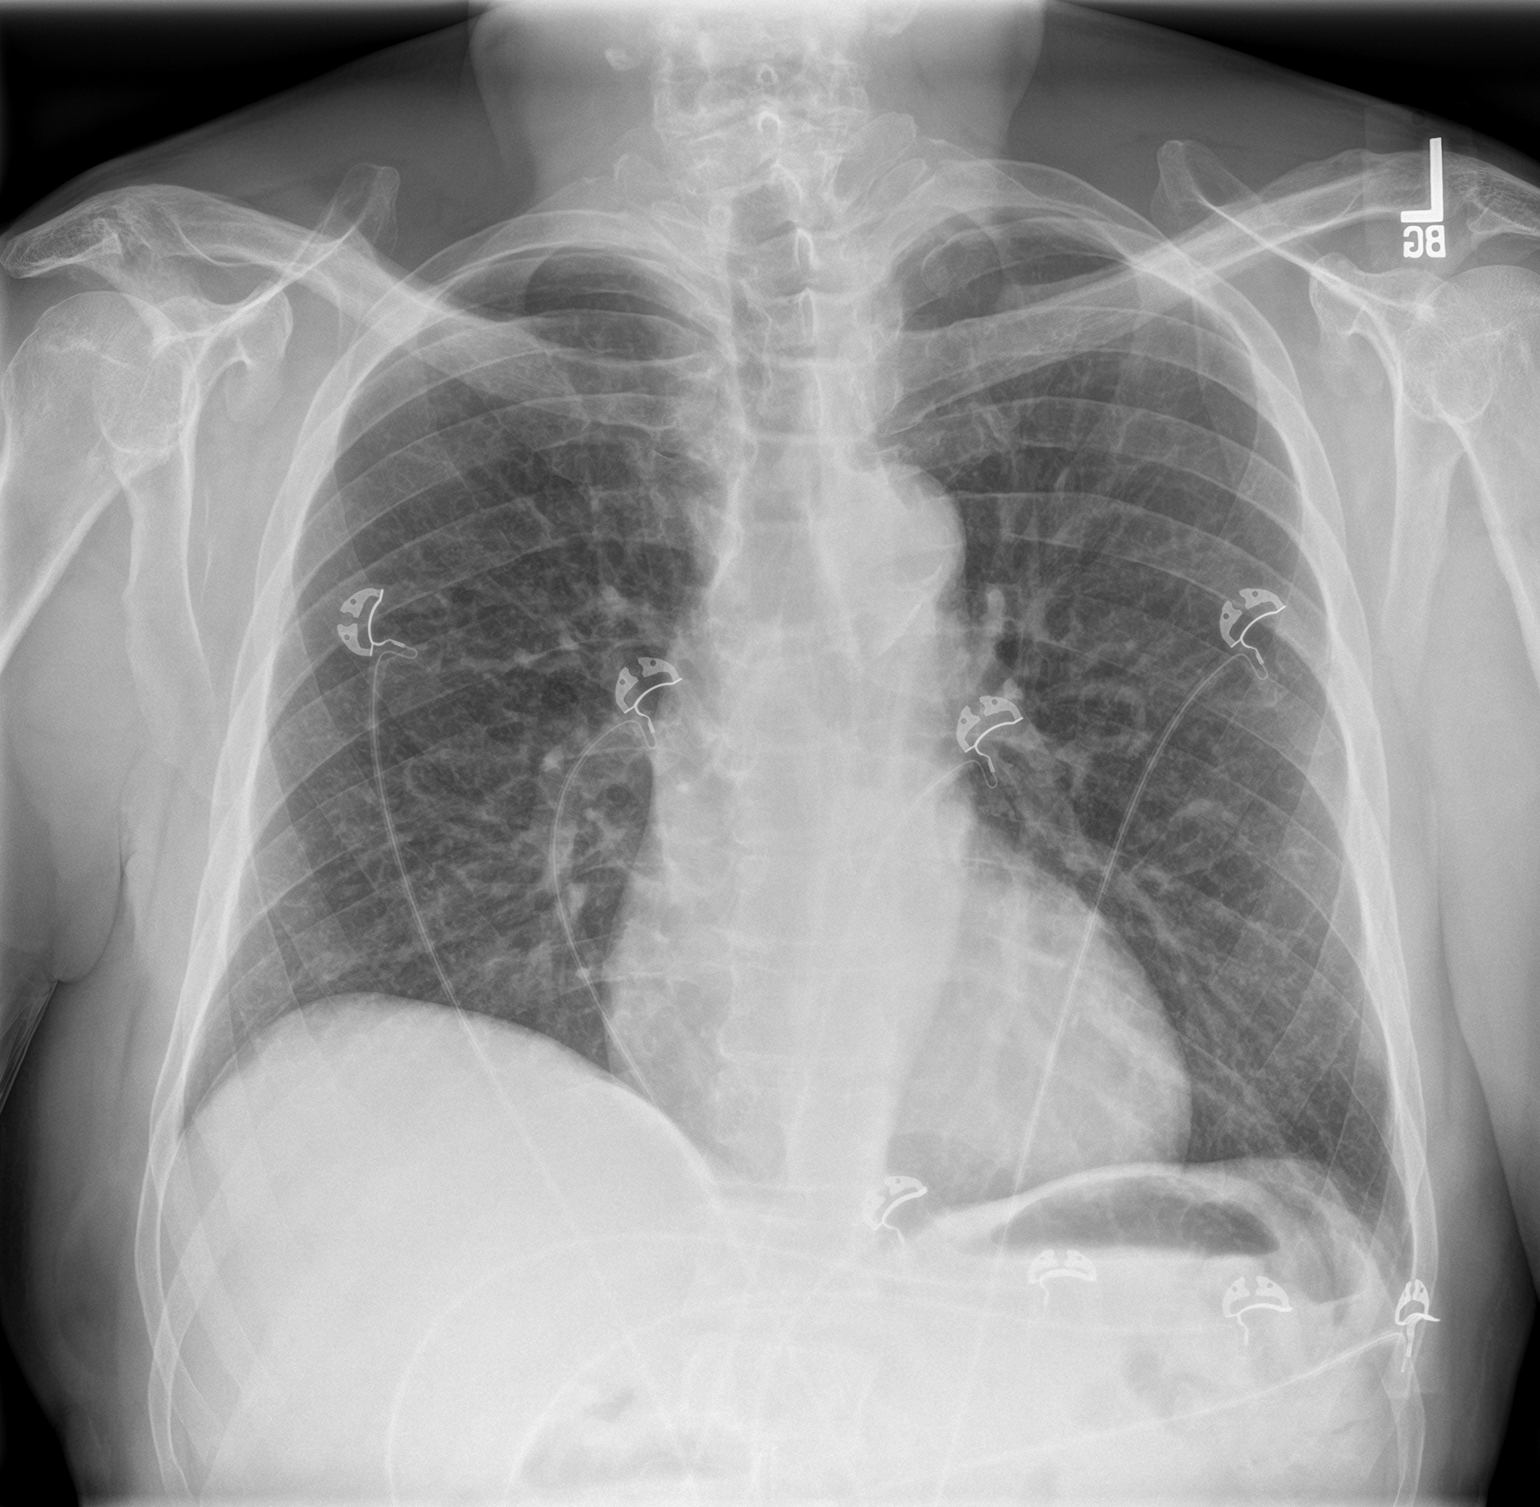

[chest lat]
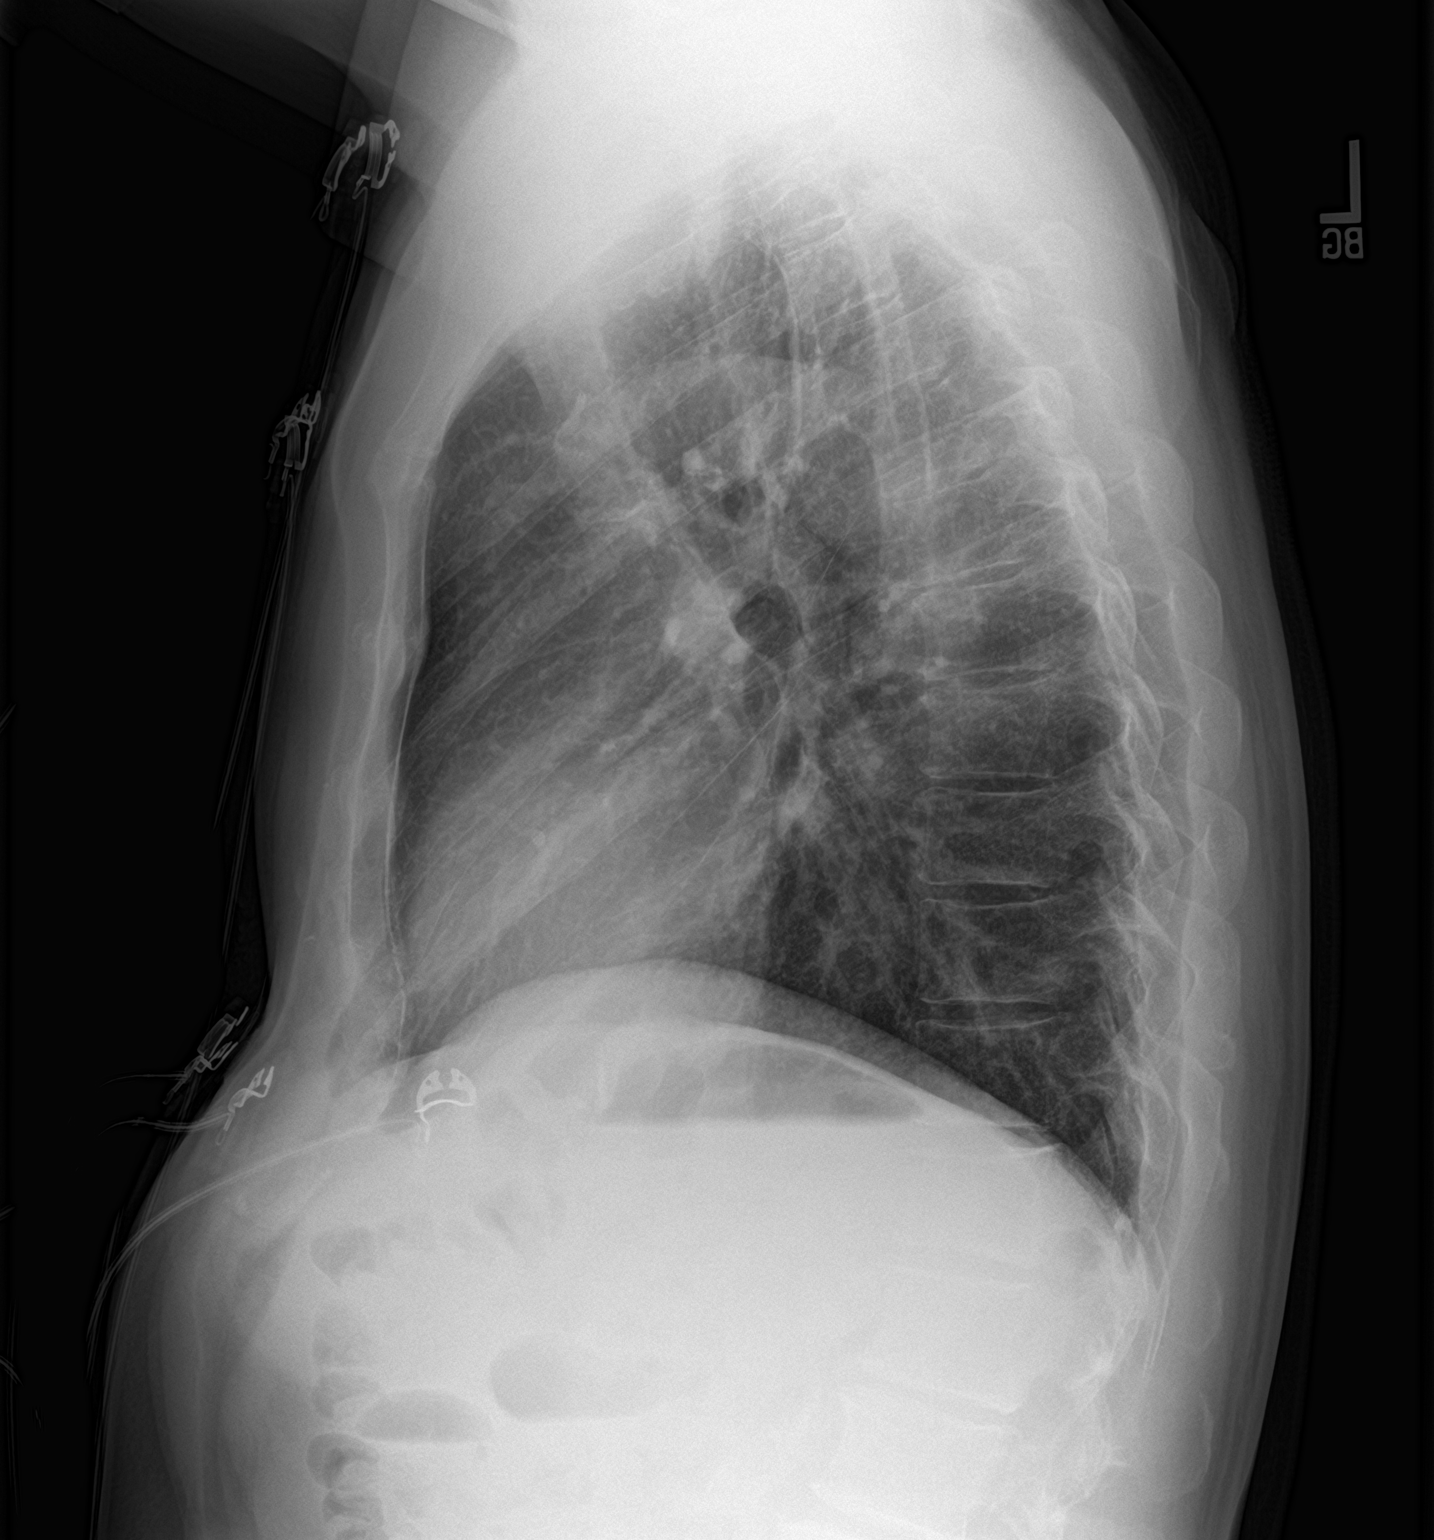

[2 of 2 positions shown; findings below may reference images not displayed]

FINDINGS: Cardiac and mediastinal contours are within normal limits. Aortic
atherosclerosis. No focal pulmonary opacity. No pleural effusion or
pneumothorax. No acute osseous abnormality.
IMPRESSION: No acute cardiopulmonary process.

## 2022-12-13 ENCOUNTER — Ambulatory Visit: Payer: No Typology Code available for payment source | Admitting: Nurse Practitioner

## 2022-12-13 VITALS — BP 108/62 | HR 73 | Temp 98.5°F | Ht 71.0 in | Wt 170.4 lb

## 2022-12-13 DIAGNOSIS — R0981 Nasal congestion: Secondary | ICD-10-CM | POA: Diagnosis not present

## 2022-12-13 DIAGNOSIS — U071 COVID-19: Secondary | ICD-10-CM

## 2022-12-13 DIAGNOSIS — J029 Acute pharyngitis, unspecified: Secondary | ICD-10-CM | POA: Diagnosis not present

## 2022-12-13 LAB — CULTURE, GROUP A STREP

## 2022-12-13 LAB — RAPID STREP SCREEN (MED CTR MEBANE ONLY): Strep Gp A Ag, IA W/Reflex: NEGATIVE

## 2022-12-13 MED ORDER — NIRMATRELVIR/RITONAVIR (PAXLOVID)TABLET: 3.0000 | ORAL_TABLET | Freq: Two times a day (BID) | ORAL | 0 refills | Status: AC

## 2022-12-13 MED ORDER — FLUTICASONE PROPIONATE 50 MCG/ACT NA SUSP: 2.0000 | Freq: Every day | NASAL | 6 refills | Status: DC

## 2022-12-13 NOTE — Progress Notes (Signed)
Acute Office Visit  Subjective:     Patient ID: Raymond Vega, male    DOB: Aug 30, 1950, 72 y.o.   MRN: [id number]  Chief Complaint  Patient presents with   Sore Throat    Has been going on for about 3 days. Has been taking tylenol with relief. Covid positive at home   Fatigue   Cough    HPI Raymond Vega is a 72 y.o. male who complains of congestion, sore throat, fatigue, myalgias, and fever for 3 days. He denies a history of anorexia, chest pain, chills, dizziness, shortness of breath, and weight loss and denies a history of asthma. Patient denies smoke cigarettes. Just came back from [place] been there for 3-weeks + at home COVID POC STREP neagtive Active Ambulatory Problems    Diagnosis Date Noted   Benign prostatic hyperplasia with urinary frequency 11/06/2021   Erectile dysfunction 11/06/2021   Mixed hyperlipidemia 11/06/2021   Primary hypertension 11/06/2021   Atrial fibrillation (HCC) 11/06/2021   Primary prostate cancer (HCC) 02/20/2017   Positive self-administered antigen test for COVID-19 12/13/2022   Sore throat 12/13/2022   Resolved Ambulatory Problems    Diagnosis Date Noted   No Resolved Ambulatory Problems   Past Medical History:  Diagnosis Date   GERD (gastroesophageal reflux disease)    Hypertension    Prostate cancer (HCC)      Review of Systems  Constitutional:  Positive for fever. Negative for chills.  Eyes:  Negative for blurred vision and pain.  Respiratory:  Positive for cough. Negative for shortness of breath.   Cardiovascular:  Positive for leg swelling. Negative for chest pain.  Gastrointestinal:  Negative for abdominal pain, constipation, diarrhea, nausea and vomiting.  Skin:  Negative for itching and rash.  Neurological:  Negative for dizziness and weakness.  Psychiatric/Behavioral:  Negative for suicidal ideas. The patient does not have insomnia.    Negative unless indicated in HPI    Objective:    BP 108/62   Pulse 73    Temp 98.5 F (36.9 C) (Temporal)   Ht 5\' 11"  (1.803 m)   Wt 170 lb 6.4 oz (77.3 kg)   SpO2 98%   BMI 23.77 kg/m  BP Readings from Last 3 Encounters:  12/13/22 108/62  06/06/22 132/81  11/06/21 139/66   Wt Readings from Last 3 Encounters:  12/13/22 170 lb 6.4 oz (77.3 kg)  07/16/22 175 lb (79.4 kg)  06/06/22 172 lb (78 kg)      Physical Exam Vitals and nursing note reviewed.  Constitutional:      General: He is not in acute distress.    Appearance: He is well-developed.  HENT:     Head: Normocephalic and atraumatic.     Nose: Congestion and rhinorrhea present.     Right Sinus: Frontal sinus tenderness present.     Left Sinus: Frontal sinus tenderness present.     Mouth/Throat:     Lips: Pink.     Mouth: Mucous membranes are moist.     Pharynx: Oropharynx is clear. Uvula midline.     Tonsils: No tonsillar exudate or tonsillar abscesses.  Eyes:     General: No scleral icterus.    Extraocular Movements: Extraocular movements intact.     Conjunctiva/sclera: Conjunctivae normal.     Pupils: Pupils are equal, round, and reactive to light.  Cardiovascular:     Rate and Rhythm: Normal rate and regular rhythm.  Pulmonary:     Effort: Pulmonary effort is normal.  Breath sounds: Normal breath sounds.  Abdominal:     General: There is no distension.     Palpations: Abdomen is soft. There is no mass.     Tenderness: There is no abdominal tenderness. There is no guarding or rebound.     Hernia: No hernia is present.  Musculoskeletal:        General: Normal range of motion.     Right lower leg: No edema.     Left lower leg: No edema.  Skin:    General: Skin is dry.     Coloration: Skin is not jaundiced.     Findings: No rash.  Neurological:     Mental Status: He is alert and oriented to person, place, and time. Mental status is at baseline.  Psychiatric:        Mood and Affect: Mood normal.        Behavior: Behavior normal.        Thought Content: Thought content  normal.     No results found for any visits on 12/13/22.      Assessment & Plan:  Positive self-administered antigen test for COVID-19 -     nirmatrelvir/ritonavir; Take 3 tablets by mouth 2 (two) times daily for 5 days. Patient GFR is 88. Take nirmatrelvir (150 mg) two tablets twice daily for 5 days and ritonavir (100 mg) one tablet twice daily for 5 days.  Dispense: 30 tablet; Refill: 0  Sore throat -     nirmatrelvir/ritonavir; Take 3 tablets by mouth 2 (two) times daily for 5 days. Patient GFR is 88. Take nirmatrelvir (150 mg) two tablets twice daily for 5 days and ritonavir (100 mg) one tablet twice daily for 5 days.  Dispense: 30 tablet; Refill: 0 -     Rapid Strep Screen ([facility] ONLY)  Nasal congestion -     Fluticasone Propionate; Place 2 sprays into both nostrils daily.  Dispense: 16 g; Refill: 6   Toa was seen for Positive at home COVID +, no acute distress COVID ordered awaiting results Will treat base on clinical symptoms Paxlovid   EGFR 88 91/13/2024) Tylenol Ibuprofen for fever OC cough syrup of choice Flonase for nasal congestion Increase hydration and rest  Call or return to clinic prn if these symptoms worsen or fail to improve as anticipated.       Person Under Monitoring Name: Lashaun Hymon  Location: [address] [place] [address]   Infection Prevention Recommendations for Individuals Confirmed to have, or Being Evaluated for, 2019 Novel Coronavirus (COVID-19) Infection Who Receive Care at Home  Individuals who are confirmed to have, or are being evaluated for, COVID-19 should follow the prevention steps below until a healthcare provider or local or state health department says they can return to normal activities.  Stay home except to get medical care You should restrict activities outside your home, except for getting medical care. Do not go to work, school, or public areas, and do not use public transportation or  taxis.  Call ahead before visiting your doctor Before your medical appointment, call the healthcare provider and tell them that you have, or are being evaluated for, COVID-19 infection. This will help the healthcare provider's office take steps to keep other people from getting infected. Ask your healthcare provider to call the local or state health department.  Monitor your symptoms Seek prompt medical attention if your illness is worsening (e.g., difficulty breathing). Before going to your medical appointment, call the healthcare provider and  tell them that you have, or are being evaluated for, COVID-19 infection. Ask your healthcare provider to call the local or state health department.  Wear a facemask You should wear a facemask that covers your nose and mouth when you are in the same room with other people and when you visit a healthcare provider. People who live with or visit you should also wear a facemask while they are in the same room with you.  Separate yourself from other people in your home As much as possible, you should stay in a different room from other people in your home. Also, you should use a separate bathroom, if available.  Avoid sharing household items You should not share dishes, drinking glasses, cups, eating utensils, towels, bedding, or other items with other people in your home. After using these items, you should wash them thoroughly with soap and water.  Cover your coughs and sneezes Cover your mouth and nose with a tissue when you cough or sneeze, or you can cough or sneeze into your sleeve. Throw used tissues in a lined trash can, and immediately wash your hands with soap and water for at least 20 seconds or use an alcohol-based hand rub.  Wash your [employer] your hands often and thoroughly with soap and water for at least 20 seconds. You can use an alcohol-based hand sanitizer if soap and water are not available and if your hands are not visibly  dirty. Avoid touching your eyes, nose, and mouth with unwashed hands.   Prevention Steps for Caregivers and Household Members of Individuals Confirmed to have, or Being Evaluated for, COVID-19 Infection Being Cared for in the Home  If you live with, or provide care at home for, a person confirmed to have, or being evaluated for, COVID-19 infection please follow these guidelines to prevent infection:  Follow healthcare provider's instructions Make sure that you understand and can help the patient follow any healthcare provider instructions for all care.  Provide for the patient's basic needs You should help the patient with basic needs in the home and provide support for getting groceries, prescriptions, and other personal needs.  Monitor the patient's symptoms If they are getting sicker, call his or her medical provider and tell them that the patient has, or is being evaluated for, COVID-19 infection. This will help the healthcare provider's office take steps to keep other people from getting infected. Ask the healthcare provider to call the local or state health department.  Limit the number of people who have contact with the patient If possible, have only one caregiver for the patient. Other household members should stay in another home or place of residence. If this is not possible, they should stay in another room, or be separated from the patient as much as possible. Use a separate bathroom, if available. Restrict visitors who do not have an essential need to be in the home.  Keep older adults, very young children, and other sick people away from the patient Keep older adults, very young children, and those who have compromised immune systems or chronic health conditions away from the patient. This includes people with chronic heart, lung, or kidney conditions, diabetes, and cancer.  Ensure good ventilation Make sure that shared spaces in the home have good air flow, such as  from an air conditioner or an opened window, weather permitting.  Wash your hands often Wash your hands often and thoroughly with soap and water for at least 20 seconds. You can use an  alcohol based hand sanitizer if soap and water are not available and if your hands are not visibly dirty. Avoid touching your eyes, nose, and mouth with unwashed hands. Use disposable paper towels to dry your hands. If not available, use dedicated cloth towels and replace them when they become wet.  Wear a facemask and gloves Wear a disposable facemask at all times in the room and gloves when you touch or have contact with the patient's blood, body fluids, and/or secretions or excretions, such as sweat, saliva, sputum, nasal mucus, vomit, urine, or feces.  Ensure the mask fits over your nose and mouth tightly, and do not touch it during use. Throw out disposable facemasks and gloves after using them. Do not reuse. Wash your hands immediately after removing your facemask and gloves. If your personal clothing becomes contaminated, carefully remove clothing and launder. Wash your hands after handling contaminated clothing. Place all used disposable facemasks, gloves, and other waste in a lined container before disposing them with other household waste. Remove gloves and wash your hands immediately after handling these items.  Do not share dishes, glasses, or other household items with the patient Avoid sharing household items. You should not share dishes, drinking glasses, cups, eating utensils, towels, bedding, or other items with a patient who is confirmed to have, or being evaluated for, COVID-19 infection. After the person uses these items, you should wash them thoroughly with soap and water.  Wash laundry thoroughly Immediately remove and wash clothes or bedding that have blood, body fluids, and/or secretions or excretions, such as sweat, saliva, sputum, nasal mucus, vomit, urine, or feces, on them. Wear gloves  when handling laundry from the patient. Read and follow directions on labels of laundry or clothing items and detergent. In general, wash and dry with the warmest temperatures recommended on the label.  Clean all areas the individual has used often Clean all touchable surfaces, such as counters, tabletops, doorknobs, bathroom fixtures, toilets, phones, keyboards, tablets, and bedside tables, every day. Also, clean any surfaces that may have blood, body fluids, and/or secretions or excretions on them. Wear gloves when cleaning surfaces the patient has come in contact with. Use a diluted bleach solution (e.g., dilute bleach with 1 part bleach and 10 parts water) or a household disinfectant with a label that says EPA-registered for coronaviruses. To make a bleach solution at home, add 1 tablespoon of bleach to 1 quart (4 cups) of water. For a larger supply, add  cup of bleach to 1 gallon (16 cups) of water. Read labels of cleaning products and follow recommendations provided on product labels. Labels contain instructions for safe and effective use of the cleaning product including precautions you should take when applying the product, such as wearing gloves or eye protection and making sure you have good ventilation during use of the product. Remove gloves and wash hands immediately after cleaning.  Monitor yourself for signs and symptoms of illness Caregivers and household members are considered close contacts, should monitor their health, and will be asked to limit movement outside of the home to the extent possible. Follow the monitoring steps for close contacts listed on the symptom monitoring form.  ? If you have additional questions, contact your local health department or call the epidemiologist on call at [phone] (available 24/7). ? This guidance is subject to change. For the most up-to-date guidance from [facility], please refer to their  website: [employer]hu  Return if symptoms worsen or fail to improve.  [address] [place],  DNP [facility] [address] [place], [place] [address] [phone]

## 2022-12-17 NOTE — Progress Notes (Signed)
Positive COVID, since client's had a positive at home COVID. Paxlovid was sent to his pharmacy hopefully he did not pick them up and took them as prescribed

## 2022-12-20 ENCOUNTER — Other Ambulatory Visit: Payer: Self-pay | Admitting: Orthopaedic Surgery

## 2022-12-26 ENCOUNTER — Other Ambulatory Visit: Payer: No Typology Code available for payment source

## 2022-12-27 ENCOUNTER — Other Ambulatory Visit: Payer: No Typology Code available for payment source

## 2022-12-27 DIAGNOSIS — C61 Malignant neoplasm of prostate: Secondary | ICD-10-CM | POA: Diagnosis not present

## 2022-12-27 DIAGNOSIS — E782 Mixed hyperlipidemia: Secondary | ICD-10-CM

## 2022-12-27 DIAGNOSIS — Z125 Encounter for screening for malignant neoplasm of prostate: Secondary | ICD-10-CM

## 2022-12-27 DIAGNOSIS — I10 Essential (primary) hypertension: Secondary | ICD-10-CM

## 2022-12-29 LAB — LIPID PANEL
Chol/HDL Ratio: 2.3 ratio (ref 0.0–5.0)
Cholesterol, Total: 136 mg/dL (ref 100–199)
HDL: 59 mg/dL
LDL Chol Calc (NIH): 66 mg/dL (ref 0–99)
Triglycerides: 46 mg/dL (ref 0–149)
VLDL Cholesterol Cal: 11 mg/dL (ref 5–40)

## 2022-12-29 LAB — CMP14+EGFR
ALT: 21 IU/L (ref 0–44)
AST: 19 IU/L (ref 0–40)
Albumin: 4.2 g/dL (ref 3.9–4.9)
Alkaline Phosphatase: 62 IU/L (ref 44–121)
BUN/Creatinine Ratio: 19 (ref 10–24)
BUN: 18 mg/dL (ref 8–27)
Bilirubin Total: 1.1 mg/dL (ref 0.0–1.2)
CO2: 27 mmol/L (ref 20–29)
Calcium: 9.7 mg/dL (ref 8.6–10.2)
Chloride: 99 mmol/L (ref 96–106)
Creatinine, Ser: 0.93 mg/dL (ref 0.76–1.27)
Globulin, Total: 2.5 g/dL (ref 1.5–4.5)
Glucose: 101 mg/dL — ABNORMAL HIGH (ref 70–99)
Potassium: 3.9 mmol/L (ref 3.5–5.2)
Sodium: 139 mmol/L (ref 134–144)
Total Protein: 6.7 g/dL (ref 6.0–8.5)
eGFR: 88 mL/min/{1.73_m2} (ref >59)

## 2022-12-29 LAB — CBC WITH DIFFERENTIAL/PLATELET
Basophils Absolute: 0 x10E3/uL (ref 0.0–0.2)
Basos: 0 %
EOS (ABSOLUTE): 0 x10E3/uL (ref 0.0–0.4)
Eos: 0 %
Hematocrit: 38.5 % (ref 37.5–51.0)
Hemoglobin: 13.4 g/dL (ref 13.0–17.7)
Immature Grans (Abs): 0 x10E3/uL (ref 0.0–0.1)
Immature Granulocytes: 0 %
Lymphocytes Absolute: 1.9 x10E3/uL (ref 0.7–3.1)
Lymphs: 21 %
MCH: 30 pg (ref 26.6–33.0)
MCHC: 34.8 g/dL (ref 31.5–35.7)
MCV: 86 fL (ref 79–97)
Monocytes Absolute: 0.7 x10E3/uL (ref 0.1–0.9)
Monocytes: 8 %
Neutrophils Absolute: 6.2 x10E3/uL (ref 1.4–7.0)
Neutrophils: 71 %
Platelets: 251 x10E3/uL (ref 150–450)
RBC: 4.46 x10E6/uL (ref 4.14–5.80)
RDW: 11.8 % (ref 11.6–15.4)
WBC: 8.9 x10E3/uL (ref 3.4–10.8)

## 2022-12-29 LAB — PSA, TOTAL AND FREE
PSA, Free Pct: 10.9 %
PSA, Free: 1.21 ng/mL
Prostate Specific Ag, Serum: 11.1 ng/mL — ABNORMAL HIGH (ref 0.0–4.0)

## 2022-12-31 ENCOUNTER — Ambulatory Visit: Payer: No Typology Code available for payment source | Admitting: Family Medicine

## 2022-12-31 ENCOUNTER — Encounter: Payer: Self-pay | Admitting: Family Medicine

## 2022-12-31 VITALS — BP 143/65 | HR 66 | Temp 97.9°F | Ht 71.0 in | Wt 169.4 lb

## 2022-12-31 DIAGNOSIS — C61 Malignant neoplasm of prostate: Secondary | ICD-10-CM

## 2022-12-31 DIAGNOSIS — I10 Essential (primary) hypertension: Secondary | ICD-10-CM | POA: Diagnosis not present

## 2022-12-31 DIAGNOSIS — K2100 Gastro-esophageal reflux disease with esophagitis, without bleeding: Secondary | ICD-10-CM | POA: Insufficient documentation

## 2022-12-31 DIAGNOSIS — E782 Mixed hyperlipidemia: Secondary | ICD-10-CM

## 2022-12-31 MED ORDER — OMEPRAZOLE 20 MG PO CPDR: 20.0000 mg | DELAYED_RELEASE_CAPSULE | Freq: Every day | ORAL | 3 refills | Status: DC

## 2022-12-31 MED ORDER — TADALAFIL 5 MG PO TABS: 5.0000 mg | ORAL_TABLET | Freq: Every day | ORAL | 3 refills | Status: DC

## 2022-12-31 NOTE — Progress Notes (Signed)
Subjective:  Patient ID: Raymond Vega, male    DOB: 1951/04/28  Age: 72 y.o. MRN: [id number]  CC: Medical Management of Chronic Issues   HPI Raymond Vega presents for follow-up of elevated cholesterol. Doing well without complaints on current medicationReports a lot of  myalgia and arthralgia "My whole body." and nausea. Also in today for liver function testing. Currently no chest pain, shortness of breath or other cardiovascular related symptoms noted.  Patient in for follow-up of GERD. Currently asymptomatic taking  PPI daily. There is no chest pain or heartburn. No hematemesis and no melena. No dysphagia or choking. Onset is remote. Progression is stable. Complicating factors, none. Sx return within two days if he tries to go off med.   PSA climbing. To see urology this week. Had recent PET.    presents for  follow-up of hypertension. Patient has no history of headache chest pain or shortness of breath or recent cough. Patient also denies symptoms of TIA such as focal numbness or weakness. Patient denies side effects from medication. States taking it regularly.   History Raymond Vega has a past medical history of GERD (gastroesophageal reflux disease), Hypertension, and Prostate cancer (HCC).   He has a past surgical history that includes Nasal fracture surgery; Fracture surgery; Circumcision; Band hemorrhoidectomy; Olecranon bursectomy (Right, 11/18/2015); Nerve and artery repair (Left, 12/24/2016); THUMB SURGERY (Left); and LEFT FINGER SURGERY (Left).   His family history includes Heart attack (age of onset: 12) in his brother; Pneumonia in his father.He reports that he has never smoked. He has never used smokeless tobacco. He reports current alcohol use. He reports that he does not use drugs.  Current Outpatient Medications on File Prior to Visit  Medication Sig Dispense Refill   acetaminophen (TYLENOL) 500 MG tablet Take 1,000 mg by mouth every 4 (four) hours as needed for moderate  pain.     Ascorbic Acid (VITAMIN C) 1000 MG tablet Take 2,000-3,000 mg by mouth daily as needed (cold symptoms).     aspirin EC 81 MG tablet Take 81 mg by mouth daily. Swallow whole.     calcium carbonate (OS-CAL) 1250 (500 Ca) MG chewable tablet Chew 1 tablet by mouth every 3 (three) days.     fluticasone (FLONASE) 50 MCG/ACT nasal spray Place 2 sprays into both nostrils daily. (Patient taking differently: Place 2 sprays into both nostrils daily as needed for allergies.) 16 g 6   melatonin 5 MG TABS Take 5 mg by mouth at bedtime as needed (sleep).     rosuvastatin (CRESTOR) 10 MG tablet Take 1 tablet (10 mg total) by mouth daily. for cholesterol 90 tablet 3   valsartan-hydrochlorothiazide (DIOVAN-HCT) 320-25 MG tablet Take 1 tablet by mouth daily. 90 tablet 3   No current facility-administered medications on file prior to visit.    ROS Review of Systems  Constitutional:  Negative for fever.  Respiratory:  Negative for shortness of breath.   Cardiovascular:  Negative for chest pain.  Musculoskeletal:  Negative for arthralgias.  Skin:  Negative for rash.    Objective:  BP (!) 143/65   Pulse 66   Temp 97.9 F (36.6 C)   Ht 5\' 11"  (1.803 m)   Wt 169 lb 6.4 oz (76.8 kg)   SpO2 98%   BMI 23.63 kg/m   BP Readings from Last 3 Encounters:  12/31/22 (!) 143/65  12/13/22 108/62  06/06/22 132/81    Wt Readings from Last 3 Encounters:  12/31/22 169 lb 6.4 oz (76.8 kg)  12/13/22 170 lb 6.4 oz (77.3 kg)  07/16/22 175 lb (79.4 kg)     Physical Exam Vitals reviewed.  Constitutional:      Appearance: He is well-developed.  HENT:     Head: Normocephalic and atraumatic.     Right Ear: External ear normal.     Left Ear: External ear normal.     Mouth/Throat:     Pharynx: No oropharyngeal exudate or posterior oropharyngeal erythema.  Eyes:     Pupils: Pupils are equal, round, and reactive to light.  Cardiovascular:     Rate and Rhythm: Normal rate and regular rhythm.     Heart  sounds: No murmur heard. Pulmonary:     Effort: No respiratory distress.     Breath sounds: Normal breath sounds.  Musculoskeletal:     Cervical back: Normal range of motion and neck supple.  Neurological:     Mental Status: He is alert and oriented to person, place, and time.     No results found for: "HGBA1C"  Lab Results  Component Value Date   WBC 8.9 12/27/2022   HGB 13.4 12/27/2022   HCT 38.5 12/27/2022   PLT 251 12/27/2022   GLUCOSE 101 (H) 12/27/2022   CHOL 136 12/27/2022   TRIG 46 12/27/2022   HDL 59 12/27/2022   LDLCALC 66 12/27/2022   ALT 21 12/27/2022   AST 19 12/27/2022   NA 139 12/27/2022   K 3.9 12/27/2022   CL 99 12/27/2022   CREATININE 0.93 12/27/2022   BUN 18 12/27/2022   CO2 27 12/27/2022    DG Chest 2 View  Result Date: 08/10/2021 CLINICAL DATA:  Chest pain, rapid heart rate EXAM: CHEST - 2 VIEW COMPARISON:  09/17/2005 FINDINGS: Cardiac and mediastinal contours are within normal limits. Aortic atherosclerosis. No focal pulmonary opacity. No pleural effusion or pneumothorax. No acute osseous abnormality. IMPRESSION: No acute cardiopulmonary process. Electronically Signed   By: Wiliam Ke M.D.   On: 08/10/2021 18:02    Assessment & Plan:   There are no diagnoses linked to this encounter. I have discontinued Raymond Vega's predniSONE. I am also having him maintain his aspirin EC, rosuvastatin, valsartan-hydrochlorothiazide, fluticasone, vitamin C, calcium carbonate, acetaminophen, melatonin, omeprazole, and tadalafil.  Meds ordered this encounter  Medications   omeprazole (PRILOSEC) 20 MG capsule    Sig: Take 1 capsule (20 mg total) by mouth daily.    Dispense:  90 capsule    Refill:  3   DISCONTD: tadalafil (CIALIS) 5 MG tablet    Sig: Take 1 tablet (5 mg total) by mouth daily.    Dispense:  90 tablet    Refill:  3   tadalafil (CIALIS) 5 MG tablet    Sig: Take 1 tablet (5 mg total) by mouth daily.    Dispense:  90 tablet    Refill:  3    Pt. Will hold statin for 2 weeks.  Record daily pain score. Resume for 2 weeks also recording daily pain score. Watch for trend of pain following med use.  Follow-up: Return in about 6 months (around 07/03/2023) for Compete physical, hypertension, cholesterol.  Mechele Claude, M.D.

## 2023-01-01 DIAGNOSIS — C61 Malignant neoplasm of prostate: Secondary | ICD-10-CM | POA: Diagnosis not present

## 2023-01-02 NOTE — Patient Instructions (Addendum)
DUE TO COVID-19 ONLY TWO VISITORS  (aged 70 and older)  ARE ALLOWED TO COME WITH YOU AND STAY IN THE WAITING ROOM ONLY DURING PRE OP AND PROCEDURE.   **NO VISITORS ARE ALLOWED IN THE SHORT STAY AREA OR RECOVERY ROOM!!**  IF YOU WILL BE ADMITTED INTO THE HOSPITAL YOU ARE ALLOWED ONLY FOUR SUPPORT PEOPLE DURING VISITATION HOURS ONLY (7 AM -8PM)   The support person(s) must pass our screening, gel in and out, and wear a mask at all times, including in the patient's room. Patients must also wear a mask when staff or their support person are in the room. Visitors GUEST BADGE MUST BE WORN VISIBLY  One adult visitor may remain with you overnight and MUST be in the room by 8 P.M.     Your procedure is scheduled on: 01/15/23   Report to [facility] Main Entrance    Report to admitting at : 7:45 AM   Call this number if you have problems the morning of surgery [phone]   Do not eat food :After Midnight.   After Midnight you may have the following liquids until : 7:00 AM DAY OF SURGERY  Water Black Coffee (sugar ok, NO MILK/CREAM OR CREAMERS)  Tea (sugar ok, NO MILK/CREAM OR CREAMERS) regular and decaf                             Plain Jell-O (NO RED)                                           Fruit ices (not with fruit pulp, NO RED)                                     Popsicles (NO RED)                                                                  Juice: apple, WHITE grape, WHITE cranberry Sports drinks like Gatorade (NO RED)   The day of surgery:  Drink ONE (1) Pre-Surgery Clear Ensure at : 7:00 AM the morning of surgery. Drink in one sitting. Do not sip.  This drink was given to you during your hospital  pre-op appointment visit. Nothing else to drink after completing the  Pre-Surgery Clear Ensure or G2.          If you have questions, please contact your surgeon's office.  FOLLOW ANY ADDITIONAL PRE OP INSTRUCTIONS YOU RECEIVED FROM YOUR SURGEON'S OFFICE!!!   Oral  Hygiene is also important to reduce your risk of infection.                                    Remember - BRUSH YOUR TEETH THE MORNING OF SURGERY WITH YOUR REGULAR TOOTHPASTE  DENTURES WILL BE REMOVED PRIOR TO SURGERY PLEASE DO NOT APPLY "Poly grip" OR ADHESIVES!!!   Do NOT smoke after Midnight   Take these medicines the morning of surgery  with A SIP OF WATER: omeprazole.                              You may not have any metal on your body including hair pins, jewelry, and body piercing             Do not wear lotions, powders, perfumes/cologne, or deodorant              Men may shave face and neck.   Do not bring valuables to the hospital. [facility] IS NOT             RESPONSIBLE   FOR VALUABLES.   Contacts, glasses, or bridgework may not be worn into surgery.   Bring small overnight bag day of surgery.   DO NOT BRING YOUR HOME MEDICATIONS TO THE HOSPITAL. PHARMACY WILL DISPENSE MEDICATIONS LISTED ON YOUR MEDICATION LIST TO YOU DURING YOUR ADMISSION IN THE HOSPITAL!    Patients discharged on the day of surgery will not be allowed to drive home.  Someone NEEDS to stay with you for the first 24 hours after anesthesia.   Special Instructions: Bring a copy of your healthcare power of attorney and living will documents         the day of surgery if you haven't scanned them before.              Please read over the following fact sheets you were given: IF YOU HAVE QUESTIONS ABOUT YOUR PRE-OP INSTRUCTIONS PLEASE CALL [phone]    [facility] - Preparing for Surgery Before surgery, you can play an important role.  Because skin is not sterile, your skin needs to be as free of germs as possible.  You can reduce the number of germs on your skin by washing with CHG (chlorahexidine gluconate) soap before surgery.  CHG is an antiseptic cleaner which kills germs and bonds with the skin to continue killing germs even after washing. Please DO NOT use if you have an allergy to CHG or  antibacterial soaps.  If your skin becomes reddened/irritated stop using the CHG and inform your nurse when you arrive at Short Stay. Do not shave (including legs and underarms) for at least 48 hours prior to the first CHG shower.  You may shave your face/neck. Please follow these instructions carefully:  1.  Shower with CHG Soap the night before surgery and the  morning of Surgery.  2.  If you choose to wash your hair, wash your hair first as usual with your  normal  shampoo.  3.  After you shampoo, rinse your hair and body thoroughly to remove the  shampoo.                           4.  Use CHG as you would any other liquid soap.  You can apply chg directly  to the skin and wash                       Gently with a scrungie or clean washcloth.  5.  Apply the CHG Soap to your body ONLY FROM THE NECK DOWN.   Do not use on face/ open                           Wound or open sores. Avoid contact with eyes, ears  mouth and genitals (private parts).                       Wash face,  Genitals (private parts) with your normal soap.             6.  Wash thoroughly, paying special attention to the area where your surgery  will be performed.  7.  Thoroughly rinse your body with warm water from the neck down.  8.  DO NOT shower/wash with your normal soap after using and rinsing off  the CHG Soap.                9.  Pat yourself dry with a clean towel.            10.  Wear clean pajamas.            11.  Place clean sheets on your bed the night of your first shower and do not  sleep with pets. Day of Surgery : Do not apply any lotions/deodorants the morning of surgery.  Please wear clean clothes to the hospital/surgery center.  FAILURE TO FOLLOW THESE INSTRUCTIONS MAY RESULT IN THE CANCELLATION OF YOUR SURGERY PATIENT SIGNATURE_________________________________  NURSE SIGNATURE__________________________________  ________________________________________________________________________  Raymond Vega  An incentive spirometer is a tool that can help keep your lungs clear and active. This tool measures how well you are filling your lungs with each breath. Taking long deep breaths may help reverse or decrease the chance of developing breathing (pulmonary) problems (especially infection) following: A long period of time when you are unable to move or be active. BEFORE THE PROCEDURE  If the spirometer includes an indicator to show your best effort, your nurse or respiratory therapist will set it to a desired goal. If possible, sit up straight or lean slightly forward. Try not to slouch. Hold the incentive spirometer in an upright position. INSTRUCTIONS FOR USE  Sit on the edge of your bed if possible, or sit up as far as you can in bed or on a chair. Hold the incentive spirometer in an upright position. Breathe out normally. Place the mouthpiece in your mouth and seal your lips tightly around it. Breathe in slowly and as deeply as possible, raising the piston or the ball toward the top of the column. Hold your breath for 3-5 seconds or for as long as possible. Allow the piston or ball to fall to the bottom of the column. Remove the mouthpiece from your mouth and breathe out normally. Rest for a few seconds and repeat Steps 1 through 7 at least 10 times every 1-2 hours when you are awake. Take your time and take a few normal breaths between deep breaths. The spirometer may include an indicator to show your best effort. Use the indicator as a goal to work toward during each repetition. After each set of 10 deep breaths, practice coughing to be sure your lungs are clear. If you have an incision (the cut made at the time of surgery), support your incision when coughing by placing a pillow or rolled up towels firmly against it. Once you are able to get out of bed, walk around indoors and cough well. You may stop using the incentive spirometer when instructed by your caregiver.  RISKS AND  COMPLICATIONS Take your time so you do not get dizzy or light-headed. If you are in pain, you may need to take or ask for pain medication before doing incentive spirometry. It is  harder to take a deep breath if you are having pain. AFTER USE Rest and breathe slowly and easily. It can be helpful to keep track of a log of your progress. Your caregiver can provide you with a simple table to help with this. If you are using the spirometer at home, follow these instructions: SEEK MEDICAL CARE IF:  You are having difficultly using the spirometer. You have trouble using the spirometer as often as instructed. Your pain medication is not giving enough relief while using the spirometer. You develop fever of 100.5 F (38.1 C) or higher. SEEK IMMEDIATE MEDICAL CARE IF:  You cough up bloody sputum that had not been present before. You develop fever of 102 F (38.9 C) or greater. You develop worsening pain at or near the incision site. MAKE SURE YOU:  Understand these instructions. Will watch your condition. Will get help right away if you are not doing well or get worse. Document Released: 09/10/2006 Document Revised: 07/23/2011 Document Reviewed: 11/11/2006 ExitCare Patient Information 2014 ExitCare, [place].   ________________________________________________________________________YOU MAY BRING A SMALL OVERNIGHT BAG

## 2023-01-03 ENCOUNTER — Other Ambulatory Visit: Payer: Self-pay

## 2023-01-03 ENCOUNTER — Encounter (HOSPITAL_COMMUNITY)
Admission: RE | Admit: 2023-01-03 | Discharge: 2023-01-03 | Disposition: A | Discharge status: Home / Self Care | Payer: No Typology Code available for payment source | Source: Ambulatory Visit | Attending: Orthopaedic Surgery | Admitting: Orthopaedic Surgery

## 2023-01-03 ENCOUNTER — Encounter (HOSPITAL_COMMUNITY): Payer: Self-pay

## 2023-01-03 VITALS — BP 143/88 | HR 69 | Temp 98.0°F | Ht 71.0 in | Wt 167.0 lb

## 2023-01-03 DIAGNOSIS — Z01818 Encounter for other preprocedural examination: Secondary | ICD-10-CM | POA: Insufficient documentation

## 2023-01-03 DIAGNOSIS — I10 Essential (primary) hypertension: Secondary | ICD-10-CM | POA: Insufficient documentation

## 2023-01-03 DIAGNOSIS — I4510 Unspecified right bundle-branch block: Secondary | ICD-10-CM | POA: Diagnosis not present

## 2023-01-03 HISTORY — DX: Cardiac arrhythmia, unspecified: I49.9

## 2023-01-03 LAB — BASIC METABOLIC PANEL
Anion gap: 8 (ref 5–15)
BUN: 15 mg/dL (ref 8–23)
CO2: 28 mmol/L (ref 22–32)
Calcium: 9.1 mg/dL (ref 8.9–10.3)
Chloride: 97 mmol/L — ABNORMAL LOW (ref 98–111)
Creatinine, Ser: 0.73 mg/dL (ref 0.61–1.24)
GFR, Estimated: >60 mL/min (ref >60)
Glucose, Bld: 88 mg/dL (ref 70–99)
Potassium: 3.5 mmol/L (ref 3.5–5.1)
Sodium: 133 mmol/L — ABNORMAL LOW (ref 135–145)

## 2023-01-03 LAB — CBC
HCT: 42.2 % (ref 39.0–52.0)
Hemoglobin: 13.9 g/dL (ref 13.0–17.0)
MCH: 29.8 pg (ref 26.0–34.0)
MCHC: 32.9 g/dL (ref 30.0–36.0)
MCV: 90.6 fL (ref 80.0–100.0)
Platelets: 229 10*3/uL (ref 150–400)
RBC: 4.66 MIL/uL (ref 4.22–5.81)
RDW: 12.5 % (ref 11.5–15.5)
WBC: 6.9 10*3/uL (ref 4.0–10.5)
nRBC: 0 % (ref 0.0–0.2)

## 2023-01-03 NOTE — Progress Notes (Addendum)
For Short Stay: COVID SWAB appointment date:  Bowel Prep reminder:   For Anesthesia: PCP - Mechele Claude, MD . Theron Arista: 12/31/22 Cardiologist - Dr. Rollene Rotunda. LOV: 08/30/21  Chest x-ray -  EKG - 01/03/23 Stress Test -  ECHO -  Cardiac Cath -  Pacemaker/ICD device last checked: Pacemaker orders received: Device Rep notified:  Spinal Cord Stimulator:  Sleep Study - Yes CPAP - NO  Fasting Blood Sugar - N/A Checks Blood Sugar _____ times a day Date and result of last Hgb A1c-  Last dose of GLP1 agonist- N/A GLP1 instructions:   Last dose of SGLT-2 inhibitors- N/A SGLT-2 instructions:   Blood Thinner Instructions: Aspirin Instructions: On Hold. Last Dose:  Activity level: Can go up a flight of stairs and activities of daily living without stopping and without chest pain and/or shortness of breath   Able to exercise without chest pain and/or shortness of breath  Anesthesia review: Hx: HTN,Chest pain,Afib(2023)  Patient denies shortness of breath, fever, cough and chest pain at PAT appointment   Patient verbalized understanding of instructions that were given to them at the PAT appointment. Patient was also instructed that they will need to review over the PAT instructions again at home before surgery.

## 2023-01-11 NOTE — H&P (Signed)
Raymond Vega is an 72 y.o. male.   Chief Complaint: Left shoulder pain HPI: Raymond Vega is here today for follow-up of both shoulders.  He is having more pain at the left shoulder.  The injection helped for a little bit but then his pain returned.  His right shoulder is doing better after the injection.  He has pain with reaching up and out.  It is in the delta regional down into his upper arm.  He tries to be regularly active with some Thera-Band exercises but does not seem to be helping.  Radiographs:  X-rays that were ordered, performed, and interpreted by me today included 4 views of the left shoulder demonstrate a type IIb acromion.  No significant glenohumeral or AC joint DJD is noted.  Past Medical History:  Diagnosis Date   Dysrhythmia    GERD (gastroesophageal reflux disease)    Hypertension    Prostate cancer (HCC)     Past Surgical History:  Procedure Laterality Date   BAND HEMORRHOIDECTOMY     CIRCUMCISION     FRACTURE SURGERY     LEFT FINGER SURGERY Left    INDEX, MIDDLE, LONG FINGERS   NASAL FRACTURE SURGERY     NERVE AND ARTERY REPAIR Left 12/24/2016   Procedure: EXPLORATION WITH REPAIRS AS NECESSARY LACERATION HAND;  Surgeon: Dairl Ponder, MD;  Location: [facility] OR;  Service: Orthopedics;  Laterality: Left;   OLECRANON BURSECTOMY Right 11/18/2015   Procedure: RIGHT ELBOW BURSECTOMY AND OLECRANON SPUR EXCISION;  Surgeon: Marcene Corning, MD;  Location: [facility];  Service: Orthopedics;  Laterality: Right;   THUMB SURGERY Left     Family History  Problem Relation Age of Onset   Pneumonia Father    Heart attack Brother 29   Social History:  reports that he has never smoked. He has never used smokeless tobacco. He reports current alcohol use. He reports that he does not use drugs.  Allergies: No Known Allergies  No medications prior to admission.    No results found for this or any previous visit (from the past 48 hour(s)). No results found.  Review  of Systems  Musculoskeletal:  Positive for arthralgias.       Left shoulder  All other systems reviewed and are negative.   There were no vitals taken for this visit. Physical Exam Constitutional:      Appearance: Normal appearance.  HENT:     Head: Normocephalic and atraumatic.     Nose: Nose normal.     Mouth/Throat:     Pharynx: Oropharynx is clear.  Eyes:     Extraocular Movements: Extraocular movements intact.  Pulmonary:     Effort: Pulmonary effort is normal.  Abdominal:     Palpations: Abdomen is soft.  Musculoskeletal:     Cervical back: Normal range of motion.     Comments: Examination of both shoulders show full range of motion.  He has pain with Hawkins and impingement of the left.  None on the right.  He does have 5 out of 5 strength but pain with cuff testing.  Normal sensation throughout.  He is neurovascularly intact distally.    Skin:    General: Skin is warm and dry.  Neurological:     General: No focal deficit present.     Mental Status: He is alert and oriented to person, place, and time.  Psychiatric:        Mood and Affect: Mood normal.        Behavior: Behavior  normal.        Thought Content: Thought content normal.        Judgment: Judgment normal.      Assessment/Plan Assessment:  Left shoulder cuff tear by MRI 2022 injected again today  Plan: [name] has terrible continued pain at the left shoulder.  I reviewed the risk of anesthesia, infection, DVT, bleeding related to a left shoulder arthroscopy.  He is agreement like to proceed.  We will get this set up at the hospital.  He can call with any question or concerns in the interim.  Ginger Organ Pasty Manninen, PA-C 01/11/2023, 8:25 AM

## 2023-01-15 ENCOUNTER — Ambulatory Visit (HOSPITAL_COMMUNITY)
Admission: RE | Admit: 2023-01-15 | Discharge: 2023-01-15 | Disposition: A | Discharge status: Home / Self Care | Payer: No Typology Code available for payment source | Attending: Orthopaedic Surgery | Admitting: Orthopaedic Surgery

## 2023-01-15 ENCOUNTER — Ambulatory Visit (HOSPITAL_COMMUNITY): Payer: No Typology Code available for payment source | Admitting: Anesthesiology

## 2023-01-15 ENCOUNTER — Encounter (HOSPITAL_COMMUNITY): Payer: Self-pay | Admitting: Orthopaedic Surgery

## 2023-01-15 ENCOUNTER — Ambulatory Visit (HOSPITAL_COMMUNITY): Payer: No Typology Code available for payment source | Admitting: Physician Assistant

## 2023-01-15 ENCOUNTER — Encounter (HOSPITAL_COMMUNITY)
Admission: RE | Disposition: A | Discharge status: Home / Self Care | Payer: Self-pay | Source: Home / Self Care | Attending: Orthopaedic Surgery

## 2023-01-15 DIAGNOSIS — M62512 Muscle wasting and atrophy, not elsewhere classified, left shoulder: Secondary | ICD-10-CM | POA: Insufficient documentation

## 2023-01-15 DIAGNOSIS — M25812 Other specified joint disorders, left shoulder: Secondary | ICD-10-CM | POA: Diagnosis not present

## 2023-01-15 DIAGNOSIS — M75102 Unspecified rotator cuff tear or rupture of left shoulder, not specified as traumatic: Secondary | ICD-10-CM | POA: Insufficient documentation

## 2023-01-15 DIAGNOSIS — I10 Essential (primary) hypertension: Secondary | ICD-10-CM | POA: Diagnosis not present

## 2023-01-15 DIAGNOSIS — M7542 Impingement syndrome of left shoulder: Secondary | ICD-10-CM | POA: Diagnosis not present

## 2023-01-15 DIAGNOSIS — G8918 Other acute postprocedural pain: Secondary | ICD-10-CM | POA: Diagnosis not present

## 2023-01-15 DIAGNOSIS — I4891 Unspecified atrial fibrillation: Secondary | ICD-10-CM | POA: Diagnosis not present

## 2023-01-15 HISTORY — PX: SHOULDER ARTHROSCOPY: SHX128

## 2023-01-15 SURGERY — ARTHROSCOPY, SHOULDER
Anesthesia: Regional | Site: Shoulder | Laterality: Left

## 2023-01-15 MED ORDER — CEFAZOLIN SODIUM-DEXTROSE 2-4 GM/100ML-% IV SOLN
2.0000 g | INTRAVENOUS | Status: AC
  Administered 2023-01-15: 2 g via INTRAVENOUS
  Filled 2023-01-15: qty 100

## 2023-01-15 MED ORDER — SUGAMMADEX SODIUM 200 MG/2ML IV SOLN
INTRAVENOUS | Status: DC | PRN
Start: 2023-01-15 — End: 2023-01-15
  Administered 2023-01-15: 400 mg via INTRAVENOUS

## 2023-01-15 MED ORDER — ORAL CARE MOUTH RINSE: 15.0000 mL | Freq: Once | OROMUCOSAL | Status: AC

## 2023-01-15 MED ORDER — CHLORHEXIDINE GLUCONATE 0.12 % MT SOLN
15.0000 mL | Freq: Once | OROMUCOSAL | Status: AC
  Administered 2023-01-15: 15 mL via OROMUCOSAL

## 2023-01-15 MED ORDER — BUPIVACAINE LIPOSOME 1.3 % IJ SUSP
INTRAMUSCULAR | Status: DC | PRN
Start: 2023-01-15 — End: 2023-01-15
  Administered 2023-01-15: 10 mL via PERINEURAL

## 2023-01-15 MED ORDER — ONDANSETRON HCL 4 MG/2ML IJ SOLN
INTRAMUSCULAR | Status: AC
  Filled 2023-01-15: qty 2

## 2023-01-15 MED ORDER — LIDOCAINE HCL (PF) 2 % IJ SOLN
INTRAMUSCULAR | Status: AC
  Filled 2023-01-15: qty 5

## 2023-01-15 MED ORDER — EPHEDRINE 5 MG/ML INJ
INTRAVENOUS | Status: AC
  Filled 2023-01-15: qty 5

## 2023-01-15 MED ORDER — EPINEPHRINE PF 1 MG/ML IJ SOLN
INTRAMUSCULAR | Status: AC
  Filled 2023-01-15: qty 1

## 2023-01-15 MED ORDER — BUPIVACAINE HCL (PF) 0.5 % IJ SOLN
INTRAMUSCULAR | Status: DC | PRN
Start: 2023-01-15 — End: 2023-01-15
  Administered 2023-01-15: 15 mL via PERINEURAL

## 2023-01-15 MED ORDER — PHENYLEPHRINE HCL-NACL 20-0.9 MG/250ML-% IV SOLN
INTRAVENOUS | Status: DC | PRN
Start: 2023-01-15 — End: 2023-01-15
  Administered 2023-01-15: 10 ug/min via INTRAVENOUS
  Administered 2023-01-15: 160 ug via INTRAVENOUS

## 2023-01-15 MED ORDER — LACTATED RINGERS IV SOLN: INTRAVENOUS | Status: DC

## 2023-01-15 MED ORDER — SODIUM CHLORIDE 0.9 % IR SOLN
Status: DC | PRN
  Administered 2023-01-15: 3000 mL

## 2023-01-15 MED ORDER — FENTANYL CITRATE (PF) 100 MCG/2ML IJ SOLN
INTRAMUSCULAR | Status: AC
  Filled 2023-01-15: qty 2

## 2023-01-15 MED ORDER — EPHEDRINE SULFATE (PRESSORS) 50 MG/ML IJ SOLN
INTRAMUSCULAR | Status: DC | PRN
Start: 2023-01-15 — End: 2023-01-15
  Administered 2023-01-15: 15 mg via INTRAVENOUS
  Administered 2023-01-15: 10 mg via INTRAVENOUS

## 2023-01-15 MED ORDER — PROPOFOL 10 MG/ML IV BOLUS
INTRAVENOUS | Status: DC | PRN
  Administered 2023-01-15: 150 mg via INTRAVENOUS

## 2023-01-15 MED ORDER — ROCURONIUM BROMIDE 10 MG/ML (PF) SYRINGE
PREFILLED_SYRINGE | INTRAVENOUS | Status: AC
  Filled 2023-01-15: qty 10

## 2023-01-15 MED ORDER — ONDANSETRON HCL 4 MG/2ML IJ SOLN
INTRAMUSCULAR | Status: DC | PRN
  Administered 2023-01-15: 4 mg via INTRAVENOUS

## 2023-01-15 MED ORDER — FENTANYL CITRATE PF 50 MCG/ML IJ SOSY
50.0000 ug | PREFILLED_SYRINGE | Freq: Once | INTRAMUSCULAR | Status: AC
  Administered 2023-01-15: 100 ug via INTRAVENOUS
  Filled 2023-01-15: qty 2

## 2023-01-15 MED ORDER — MIDAZOLAM HCL 2 MG/2ML IJ SOLN
1.0000 mg | Freq: Once | INTRAMUSCULAR | Status: DC
  Filled 2023-01-15: qty 2

## 2023-01-15 MED ORDER — DEXAMETHASONE SODIUM PHOSPHATE 10 MG/ML IJ SOLN
INTRAMUSCULAR | Status: AC
  Filled 2023-01-15: qty 1

## 2023-01-15 MED ORDER — AMISULPRIDE (ANTIEMETIC) 5 MG/2ML IV SOLN
INTRAVENOUS | Status: AC
  Filled 2023-01-15: qty 4

## 2023-01-15 MED ORDER — HYDROCODONE-ACETAMINOPHEN 5-325 MG PO TABS: 1.0000 | ORAL_TABLET | Freq: Four times a day (QID) | ORAL | 0 refills | Status: DC | PRN

## 2023-01-15 MED ORDER — ACETAMINOPHEN 500 MG PO TABS: 1000.0000 mg | ORAL_TABLET | Freq: Once | ORAL | Status: DC

## 2023-01-15 MED ORDER — DEXAMETHASONE SODIUM PHOSPHATE 4 MG/ML IJ SOLN
INTRAMUSCULAR | Status: DC | PRN
Start: 2023-01-15 — End: 2023-01-15
  Administered 2023-01-15: 8 mg via INTRAVENOUS

## 2023-01-15 MED ORDER — FENTANYL CITRATE PF 50 MCG/ML IJ SOSY: 25.0000 ug | PREFILLED_SYRINGE | INTRAMUSCULAR | Status: DC | PRN

## 2023-01-15 MED ORDER — FENTANYL CITRATE (PF) 100 MCG/2ML IJ SOLN
INTRAMUSCULAR | Status: DC | PRN
  Administered 2023-01-15: 25 ug via INTRAVENOUS

## 2023-01-15 MED ORDER — ROCURONIUM BROMIDE 100 MG/10ML IV SOLN
INTRAVENOUS | Status: DC | PRN
  Administered 2023-01-15: 60 mg via INTRAVENOUS

## 2023-01-15 MED ORDER — AMISULPRIDE (ANTIEMETIC) 5 MG/2ML IV SOLN
10.0000 mg | Freq: Once | INTRAVENOUS | Status: AC
  Administered 2023-01-15: 10 mg via INTRAVENOUS

## 2023-01-15 MED ORDER — PROPOFOL 10 MG/ML IV BOLUS
INTRAVENOUS | Status: AC
  Filled 2023-01-15: qty 20

## 2023-01-15 MED ORDER — POVIDONE-IODINE 10 % EX SWAB
2.0000 | Freq: Once | CUTANEOUS | Status: AC
  Administered 2023-01-15: 2 via TOPICAL

## 2023-01-15 SURGICAL SUPPLY — 43 items
AID PSTN UNV HD RSTRNT DISP (MISCELLANEOUS) ×1
BAG COUNTER SPONGE SURGICOUNT (BAG) IMPLANT
BAG SPNG CNTER NS LX DISP (BAG)
BLADE EXCALIBUR 4.0X13 (MISCELLANEOUS) ×1 IMPLANT
BURR OVAL 8 FLU 4.0X13 (MISCELLANEOUS) ×1 IMPLANT
CANNULA SHOULDER 7CM (CANNULA) IMPLANT
CANNULA TWIST IN 8.25X7CM (CANNULA) ×1 IMPLANT
COVER SURGICAL LIGHT HANDLE (MISCELLANEOUS) ×1 IMPLANT
DRAPE ORTHO SPLIT 77X108 STRL (DRAPES) ×2
DRAPE SHEET LG 3/4 BI-LAMINATE (DRAPES) ×2 IMPLANT
DRAPE STERI 35X30 U-POUCH (DRAPES) ×1 IMPLANT
DRAPE SURG 17X11 SM STRL (DRAPES) ×1 IMPLANT
DRAPE SURG ORHT 6 SPLT 77X108 (DRAPES) ×2 IMPLANT
DRAPE U-SHAPE 47X51 STRL (DRAPES) ×1 IMPLANT
DRSG EMULSION OIL 3X3 NADH (GAUZE/BANDAGES/DRESSINGS) ×1 IMPLANT
DURAPREP 26ML APPLICATOR (WOUND CARE) ×1 IMPLANT
GAUZE PAD ABD 8X10 STRL (GAUZE/BANDAGES/DRESSINGS) ×3 IMPLANT
GAUZE SPONGE 4X4 12PLY STRL (GAUZE/BANDAGES/DRESSINGS) ×1 IMPLANT
GLOVE BIO SURGEON STRL SZ8 (GLOVE) ×2 IMPLANT
GLOVE BIOGEL PI IND STRL 7.0 (GLOVE) ×1 IMPLANT
GLOVE BIOGEL PI IND STRL 8 (GLOVE) ×2 IMPLANT
GLOVE SURG SYN 7.0 (GLOVE) ×1
GLOVE SURG SYN 7.0 PF PI (GLOVE) ×1 IMPLANT
GOWN SRG XL LVL 4 BRTHBL STRL (GOWNS) ×1 IMPLANT
GOWN STRL NON-REIN XL LVL4 (GOWNS) ×1
GOWN STRL REUS W/ TWL XL LVL3 (GOWN DISPOSABLE) ×2 IMPLANT
GOWN STRL REUS W/TWL XL LVL3 (GOWN DISPOSABLE) ×2
KIT BASIN OR (CUSTOM PROCEDURE TRAY) ×1 IMPLANT
KIT TURNOVER KIT A (KITS) IMPLANT
MANIFOLD NEPTUNE II (INSTRUMENTS) ×1 IMPLANT
NDL SAFETY ECLIP 18X1.5 (MISCELLANEOUS) ×1 IMPLANT
PACK ARTHROSCOPY WL (CUSTOM PROCEDURE TRAY) ×1 IMPLANT
PORT APPOLLO RF 90DEGREE MULTI (SURGICAL WAND) ×1 IMPLANT
PROBE BIPOLAR ATHRO 135MM 90D (MISCELLANEOUS) ×1 IMPLANT
PROTECTOR NERVE ULNAR (MISCELLANEOUS) ×1 IMPLANT
RESTRAINT HEAD UNIVERSAL NS (MISCELLANEOUS) ×1 IMPLANT
SLING ARM FOAM STRAP MED (SOFTGOODS) IMPLANT
SLING ARM IMMOBILIZER LRG (SOFTGOODS) IMPLANT
SLING ARM IMMOBILIZER MED (SOFTGOODS) ×1 IMPLANT
SUT ETHILON 4 0 PS 2 18 (SUTURE) ×1 IMPLANT
TUBING ARTHROSCOPY IRRIG 16FT (MISCELLANEOUS) ×1 IMPLANT
TUBING CONNECTING 10 (TUBING) ×1 IMPLANT
WAND ABLATOR APOLLO I90 (BUR) IMPLANT

## 2023-01-15 NOTE — Brief Op Note (Signed)
Raymond Vega [id number] 01/15/2023   PRE-OP DIAGNOSIS: left sh RCT  POST-OP DIAGNOSIS: same  PROCEDURE: left sh scope  ANESTHESIA: general and block  [name]   Dictation #:  [id number]

## 2023-01-15 NOTE — Anesthesia Procedure Notes (Signed)
Procedure Name: Intubation Date/Time: 01/15/2023 10:06 AM  Performed by: Ahmed Prima, CRNAPre-anesthesia Checklist: Patient identified, Emergency Drugs available, Suction available and Patient being monitored Patient Re-evaluated:Patient Re-evaluated prior to induction Oxygen Delivery Method: Circle system utilized Preoxygenation: Pre-oxygenation with 100% oxygen Induction Type: IV induction Ventilation: Mask ventilation without difficulty and Oral airway inserted - appropriate to patient size Laryngoscope Size: Glidescope, Mac and 3 Grade View: Grade II Tube type: Oral Number of attempts: 1 Airway Equipment and Method: Stylet and Oral airway Placement Confirmation: ETT inserted through vocal cords under direct vision, positive ETCO2 and breath sounds checked- equal and bilateral Secured at: 23 cm Tube secured with: Tape Dental Injury: Teeth and Oropharynx as per pre-operative assessment  Comments: Soft bite block placed on pt.'s left side of mouth in between pt.'s back teeth. Pt.'s tongue is free from pressure.

## 2023-01-15 NOTE — Transfer of Care (Signed)
Immediate Anesthesia Transfer of Care Note  Patient: Raymond Vega  Procedure(s) Performed: LEFT SHOULDER ARTHROSCOPY, ACROMIOPLASTY AND DEBRIDEMENT (Left: Shoulder)  Patient Location: PACU  Anesthesia Type:General  Level of Consciousness: drowsy  Airway & Oxygen Therapy: Patient Spontanous Breathing and Patient connected to face mask oxygen  Post-op Assessment: Report given to RN and Post -op Vital signs reviewed and stable  Post vital signs: Reviewed and stable  Last Vitals:  Vitals Value Taken Time  BP 151/79 01/15/23 1111  Temp    Pulse 75 01/15/23 1113  Resp 11 01/15/23 1113  SpO2 100 % 01/15/23 1113  Vitals shown include unfiled device data.  Last Pain:  Vitals:   01/15/23 0819  TempSrc: Oral         Complications: No notable events documented.

## 2023-01-15 NOTE — Anesthesia Procedure Notes (Signed)
Anesthesia Regional Block: Interscalene brachial plexus block   Pre-Anesthetic Checklist: , timeout performed,  Correct Patient, Correct Site, Correct Laterality,  Correct Procedure, Correct Position, site marked,  Risks and benefits discussed,  Pre-op evaluation,  At surgeon's request and post-op pain management  Laterality: Left  Prep: Maximum Sterile Barrier Precautions used, chloraprep       Needles:  Injection technique: Single-shot  Needle Type: Echogenic Stimulator Needle     Needle Length: 5cm  Needle Gauge: 21     Additional Needles:   Procedures:,,,, ultrasound used (permanent image in chart),,    Narrative:  Start time: 01/15/2023 9:15 AM End time: 01/15/2023 9:17 AM Injection made incrementally with aspirations every 5 mL. Anesthesiologist: Elmer Picker, MD

## 2023-01-15 NOTE — Op Note (Signed)
NAME: Raymond Vega, Raymond Vega MEDICAL RECORD NO: [id number] ACCOUNT NO: [id number] DATE OF BIRTH: 04-23-1951 FACILITY: [facility] LOCATION: [facility] PHYSICIAN: Lubertha Basque. Jerl Santos, MD  Operative Report   DATE OF PROCEDURE: 01/15/2023  PREOPERATIVE DIAGNOSES:   1.  Left shoulder rotator cuff tear. 2.  Left shoulder impingement.  POSTOPERATIVE DIAGNOSES:   1.  Left shoulder rotator cuff tear. 2.  Left shoulder impingement.  PROCEDURES:   1.  Left shoulder arthroscopic debridement. 2.  Left shoulder arthroscopic acromioplasty.  ANESTHESIA: General and block.  ATTENDING SURGEON:  Lubertha Basque. Jerl Santos, MD.  ASSISTANT:  Elodia Florence, PA.  INDICATIONS FOR THE PROCEDURE:  The patient is a 72 year old man with years of bilateral shoulder issues.  He has been treated extensively in the past with injections and physical therapy.  By MRI scan several years back, he had a significant cuff tear  on the left with some mild muscle atrophy.  He suffered several injuries since, and at this point is offered arthroscopic intervention as he has difficulty using his arm and resting.  Informed operative consent was obtained after discussion of possible  complications including, reaction to anesthesia and infection.  SUMMARY OF FINDINGS AND PROCEDURE:  Under general anesthesia and a block, a left shoulder arthroscopy was performed through total of 2 portals.  Glenohumeral joint showed no degenerative change and the biceps tendon looked normal.  The rotator cuff had a  large tear easily seen from below and above.  He also had a moderately prominent subacromial morphology addressed with an acromioplasty.  We elected to debride the rotator cuff as the tissue was quite poor.  We left him with anterior and posterior  sleeves of tissue, which did operate the shoulder well.  He was scheduled to go home same day.   DESCRIPTION OF PROCEDURE:  The patient was taken to the operating suite where general anesthetic was applied without  difficulty.  He was also given a block in the preanesthesia area.  He was positioned in beach chair position and prepped and draped in  normal sterile fashion.  After the administration of preoperative IV Kefzol and appropriate time-out, an arthroscopy of the left shoulder was performed through a total of 2 portals.  Findings were as noted above and procedure consisted of the  acromioplasty done with the bur in the lateral position followed by transfer of the bur to the posterior position.  We then performed extensive debridement of the infraspinatus and supraspinatus.  The tissues were very poor and easily shaved back towards  the glenoid.  We did leave him with anterior and posterior sleeves of tissue, which should operate the humeral head.  I also debrided extensively the subacromial bursa and subdeltoid region.  The shoulder was thoroughly irrigated, followed by removal of  arthroscopic equipment.  Simple sutures of nylon were used to loosely reapproximate the portals followed by Adaptic, dry gauze and tape.  Estimated blood loss and intraoperative fluids can be obtained from anesthesia records.   DISPOSITION: The patient was extubated in the operating room and taken to recovery room in stable condition.  Plans were for him to go home same day and follow up in the office in less than week.  I will contact him by phone tonight.      PAA D: 01/15/2023 10:59:01 am T: 01/15/2023 11:13:00 am  JOB: [id number]/ [id number]

## 2023-01-15 NOTE — Interval H&P Note (Signed)
History and Physical Interval Note:  01/15/2023 8:27 AM  Raymond Vega  has presented today for surgery, with the diagnosis of LEFT SHOULDER ROTATOR CUFF TEAR.  The various methods of treatment have been discussed with the patient and family. After consideration of risks, benefits and other options for treatment, the patient has consented to  Procedure(s): LEFT SHOULDER ARTHROSCOPY (Left) as a surgical intervention.  The patient's history has been reviewed, patient examined, no change in status, stable for surgery.  I have reviewed the patient's chart and labs.  Questions were answered to the patient's satisfaction.     [name]

## 2023-01-15 NOTE — Anesthesia Preprocedure Evaluation (Addendum)
Anesthesia Evaluation  Patient identified by MRN, date of birth, ID band Patient awake    Reviewed: Allergy & Precautions, NPO status , Patient's Chart, lab work & pertinent test results  Airway Mallampati: II  TM Distance: >3 FB Neck ROM: Full    Dental no notable dental hx. (+) Teeth Intact, Dental Advisory Given   Pulmonary neg pulmonary ROS   Pulmonary exam normal breath sounds clear to auscultation       Cardiovascular hypertension, Pt. on medications Normal cardiovascular exam+ dysrhythmias Atrial Fibrillation  Rhythm:Regular Rate:Normal     Neuro/Psych negative neurological ROS  negative psych ROS   GI/Hepatic Neg liver ROS,GERD  ,,  Endo/Other  negative endocrine ROS    Renal/GU negative Renal ROS  negative genitourinary   Musculoskeletal negative musculoskeletal ROS (+)    Abdominal   Peds  Hematology negative hematology ROS (+)   Anesthesia Other Findings   Reproductive/Obstetrics                             Anesthesia Physical Anesthesia Plan  ASA: 2  Anesthesia Plan: General and Regional   Post-op Pain Management: Regional block* and Tylenol PO (pre-op)*   Induction: Intravenous  PONV Risk Score and Plan: 2 and Dexamethasone, Ondansetron and Treatment may vary due to age or medical condition  Airway Management Planned: Oral ETT  Additional Equipment:   Intra-op Plan:   Post-operative Plan: Extubation in OR  Informed Consent: I have reviewed the patients History and Physical, chart, labs and discussed the procedure including the risks, benefits and alternatives for the proposed anesthesia with the patient or authorized representative who has indicated his/her understanding and acceptance.     Dental advisory given  Plan Discussed with: CRNA  Anesthesia Plan Comments:        Anesthesia Quick Evaluation

## 2023-01-15 NOTE — Anesthesia Postprocedure Evaluation (Signed)
Anesthesia Post Note  Patient: Raymond Vega  Procedure(s) Performed: LEFT SHOULDER ARTHROSCOPY, ACROMIOPLASTY AND DEBRIDEMENT (Left: Shoulder)     Patient location during evaluation: PACU Anesthesia Type: Regional and General Level of consciousness: awake and alert Pain management: pain level controlled Vital Signs Assessment: post-procedure vital signs reviewed and stable Respiratory status: spontaneous breathing, nonlabored ventilation, respiratory function stable and patient connected to nasal cannula oxygen Cardiovascular status: blood pressure returned to baseline and stable Postop Assessment: no apparent nausea or vomiting Anesthetic complications: no  No notable events documented.  Last Vitals:  Vitals:   01/15/23 1200 01/15/23 1215  BP: (!) 149/94 (!) 154/93  Pulse: 73 74  Resp: 16 15  Temp:    SpO2: 100% 100%    Last Pain:  Vitals:   01/15/23 1240  TempSrc:   PainSc: 0-No pain                 Tennie Grussing L Farheen Pfahler

## 2023-01-16 ENCOUNTER — Encounter (HOSPITAL_COMMUNITY): Payer: Self-pay | Admitting: Orthopaedic Surgery

## 2023-03-01 DIAGNOSIS — M7542 Impingement syndrome of left shoulder: Secondary | ICD-10-CM | POA: Diagnosis not present

## 2023-03-01 DIAGNOSIS — M6281 Muscle weakness (generalized): Secondary | ICD-10-CM | POA: Diagnosis not present

## 2023-03-01 DIAGNOSIS — M25612 Stiffness of left shoulder, not elsewhere classified: Secondary | ICD-10-CM | POA: Diagnosis not present

## 2023-03-15 DIAGNOSIS — C61 Malignant neoplasm of prostate: Secondary | ICD-10-CM | POA: Diagnosis not present

## 2023-03-25 DIAGNOSIS — M25612 Stiffness of left shoulder, not elsewhere classified: Secondary | ICD-10-CM | POA: Diagnosis not present

## 2023-05-17 ENCOUNTER — Other Ambulatory Visit: Payer: Self-pay | Admitting: Family Medicine

## 2023-05-17 NOTE — Telephone Encounter (Unsigned)
 Copied from CRM [id number]. Topic: Clinical - Request for Lab/Test Order >> May 17, 2023 10:33 AM Raymond Vega wrote: Reason for CRM: Pt requesting lab work for upcoming appoint on 01/08 for PSA, Cholesterol, and basic blood test to go over with Dr. Lequita Halt

## 2023-05-20 ENCOUNTER — Other Ambulatory Visit: Payer: No Typology Code available for payment source

## 2023-05-20 ENCOUNTER — Other Ambulatory Visit: Payer: Self-pay | Admitting: Family Medicine

## 2023-05-20 ENCOUNTER — Ambulatory Visit: Payer: No Typology Code available for payment source | Admitting: Family Medicine

## 2023-05-20 ENCOUNTER — Telehealth: Payer: Self-pay | Admitting: Family Medicine

## 2023-05-20 DIAGNOSIS — I10 Essential (primary) hypertension: Secondary | ICD-10-CM

## 2023-05-20 DIAGNOSIS — E782 Mixed hyperlipidemia: Secondary | ICD-10-CM

## 2023-05-20 DIAGNOSIS — C61 Malignant neoplasm of prostate: Secondary | ICD-10-CM

## 2023-05-20 DIAGNOSIS — I4891 Unspecified atrial fibrillation: Secondary | ICD-10-CM

## 2023-05-20 DIAGNOSIS — N401 Enlarged prostate with lower urinary tract symptoms: Secondary | ICD-10-CM

## 2023-05-20 NOTE — Telephone Encounter (Signed)
 I wrote the orders. He needs an appointment with me to review that. Ms. Raymond Vega cannot do a check up during an acute visit while seeing him for his foot. That is what I do as his PCP.

## 2023-05-20 NOTE — Telephone Encounter (Signed)
 He will need to see PCP for any chronic issues or routine labs.

## 2023-05-20 NOTE — Telephone Encounter (Signed)
 Patient walked in today for labwork.  He has appointment with Annabella Search for a growth on foot on 1/8.  Pt requesting lab work for upcoming appointment on 01/08 for PSA, Cholesterol, and basic blood test to go over with Dr. Search.  Patient was informed that he would need to have his primary doctor (per Tiffany) to have this ordered and that we would not be able to do this without an order.  Patient has not seen Dr. Zollie since 12/2022.  Patient is very upset and states that our call center mislead him.  He also states his urologist is following his PSA.  I asked that he have his urologist send the orders over for that PSA.  Again patient was upset and wants to know what can be done.  If someone could please call patient with guidance of what he needs to do.

## 2023-05-21 ENCOUNTER — Telehealth: Payer: Self-pay

## 2023-05-21 NOTE — Telephone Encounter (Signed)
 Patient aware and very upset with [facility].  They told him he could get lab work for an acute visit with not his PCP before visit.  I had to let patient know that we do not do that for acute visits. Would like a call back from manager

## 2023-05-21 NOTE — Telephone Encounter (Signed)
 Error

## 2023-05-21 NOTE — Telephone Encounter (Signed)
 Patient aware and verbalizes understanding.

## 2023-05-22 ENCOUNTER — Encounter: Payer: Self-pay | Admitting: Family Medicine

## 2023-05-22 ENCOUNTER — Ambulatory Visit: Payer: No Typology Code available for payment source | Admitting: Family Medicine

## 2023-05-22 VITALS — BP 136/69 | HR 75 | Temp 98.0°F | Ht 71.0 in | Wt 180.4 lb

## 2023-05-22 DIAGNOSIS — N401 Enlarged prostate with lower urinary tract symptoms: Secondary | ICD-10-CM | POA: Diagnosis not present

## 2023-05-22 DIAGNOSIS — I10 Essential (primary) hypertension: Secondary | ICD-10-CM | POA: Diagnosis not present

## 2023-05-22 DIAGNOSIS — B070 Plantar wart: Secondary | ICD-10-CM

## 2023-05-22 DIAGNOSIS — R350 Frequency of micturition: Secondary | ICD-10-CM | POA: Diagnosis not present

## 2023-05-22 DIAGNOSIS — C61 Malignant neoplasm of prostate: Secondary | ICD-10-CM | POA: Diagnosis not present

## 2023-05-22 DIAGNOSIS — I4891 Unspecified atrial fibrillation: Secondary | ICD-10-CM | POA: Diagnosis not present

## 2023-05-22 DIAGNOSIS — E782 Mixed hyperlipidemia: Secondary | ICD-10-CM | POA: Diagnosis not present

## 2023-05-22 NOTE — Progress Notes (Signed)
   Acute Office Visit  Subjective:     Patient ID: Raymond Vega, male    DOB: Mar 18, 1951, 73 y.o.   MRN: [id number]  Chief Complaint  Patient presents with   Foot Pain    Foot Pain   Patient is in today for a wart of the bottom of his right foot. He has had pain with walking for 6 weeks. No exudate or drainage. Has not tried an OTC treatments. Interested in cyro today.   Repots BP has been elevated at home. Has had readings on 150-170s systolic.   ROS As per HPI.      Objective:    BP 136/69   Pulse 75   Temp 98 F (36.7 C) (Temporal)   Ht 5' 11 (1.803 m)   Wt 180 lb 6.4 oz (81.8 kg)   SpO2 100%   BMI 25.16 kg/m  BP Readings from Last 3 Encounters:  05/22/23 136/69  01/15/23 (!) 143/91  01/03/23 (!) 143/88      Physical Exam Vitals and nursing note reviewed.  Constitutional:      General: He is not in acute distress.    Appearance: He is not ill-appearing, toxic-appearing or diaphoretic.  Pulmonary:     Effort: Pulmonary effort is normal. No respiratory distress.  Musculoskeletal:     Right lower leg: No edema.     Left lower leg: No edema.  Feet:     Comments: Plantar wart present to left foot. No signs of infection.  Skin:    General: Skin is warm and dry.  Neurological:     General: No focal deficit present.     Mental Status: He is alert and oriented to person, place, and time.  Psychiatric:        Mood and Affect: Mood normal.        Behavior: Behavior normal.    Cryotherapy Procedure:  Risks and benefits of procedure were reviewed with the patient.  Written consent obtained and scanned into the chart.  Lesion of concern was identified and located on left foot.  Liquid nitrogen was applied to area of concern and extending out beyond the border of the lesion.  Treated area was allowed to come back to room temperature before treating it a second time.  Patient tolerated procedure well and there were no immediate complications.  Home  care instructions were reviewed with the patient and a handout was provided.  No results found for any visits on 05/22/23.      Assessment & Plan:   Raymond Vega was seen today for foot pain.  Diagnoses and all orders for this visit:  Plantar wart of left foot Cryotherapy today. Tolerated well. After care discussed. Discussed referral to podiatry if symptoms persist.   Primary hypertension BP at goal today. Elevated at home. Discussed may be due to an inaccurate home BP monitor. Recheck BP in 2 weeks. Bring home BP monitor.     Return in about 2 weeks (around 06/05/2023) for PCP or nurse for BP check. Bring home BP monitor..  The patient indicates understanding of these issues and agrees with the plan.  Raymond CHRISTELLA Search, FNP

## 2023-05-22 NOTE — Patient Instructions (Signed)
 Cryosurgery for Skin Conditions Cryosurgery, also called cryotherapy, is the use of extremely cold liquid (liquid nitrogen) to freeze and remove abnormal tissue. Cryosurgery may be used to remove certain growths on the skin, such as: Warts. Skin sores that could turn into cancer (precancerous skin lesions or actinic keratoses). Some skin cancers. Cryosurgery usually takes a few minutes, and it can be done in your health care provider's office. Tell a health care provider about: Any allergies you have. All medicines you are taking, including vitamins, herbs, eye drops, creams, and over-the-counter medicines. Any problems you or family members have had with anesthesia. Any bleeding problems you have. Any surgeries you have had. Any medical conditions you have. Whether you are pregnant or may be pregnant. What are the risks? Your provider will talk with you about risks. These may include: Infection. Bleeding. Scars. Changes in skin color. These may include skin that is lighter or darker than it was before it was treated. Swelling. Hair loss in the treated area. Damage to nearby structures or organs, such as nerve damage and loss of feeling. This is rare. What happens before the procedure? Your provider will describe the procedure and discuss the benefits and risks of the procedure with you. What happens during the procedure?  Your treatment will be done in one of these two ways: Your provider may apply a device (probe) to the skin. The probe has liquid nitrogen flowing through it to cool it down. The probe will be applied to the skin until the growth is frozen and destroyed. Your provider may apply liquid nitrogen to the skin until the growth is frozen and destroyed with: A swab. A spray. The treated area may be covered with a bandage (dressing). The procedure may vary among providers and hospitals. What happens after the procedure? Your blood pressure, heart rate, breathing rate, and  blood oxygen level may be monitored until you leave the hospital or clinic. You may have a mild stinging or burning feeling in the area that was treated. This information is not intended to replace advice given to you by your health care provider. Make sure you discuss any questions you have with your health care provider. Document Revised: 05/24/2022 Document Reviewed: 12/28/2021 Elsevier Patient Education  2024 [employer].

## 2023-05-23 LAB — LIPID PANEL
Chol/HDL Ratio: 2.3 {ratio} (ref 0.0–5.0)
Cholesterol, Total: 140 mg/dL (ref 100–199)
HDL: 60 mg/dL (ref >39)
LDL Chol Calc (NIH): 68 mg/dL (ref 0–99)
Triglycerides: 59 mg/dL (ref 0–149)
VLDL Cholesterol Cal: 12 mg/dL (ref 5–40)

## 2023-05-23 LAB — CMP14+EGFR
ALT: 22 [IU]/L (ref 0–44)
AST: 21 [IU]/L (ref 0–40)
Albumin: 4.2 g/dL (ref 3.9–4.9)
Alkaline Phosphatase: 63 [IU]/L (ref 44–121)
BUN/Creatinine Ratio: 20 (ref 10–24)
BUN: 20 mg/dL (ref 8–27)
Bilirubin Total: 1.1 mg/dL (ref 0.0–1.2)
CO2: 25 mmol/L (ref 20–29)
Calcium: 9.1 mg/dL (ref 8.6–10.2)
Chloride: 98 mmol/L (ref 96–106)
Creatinine, Ser: 1.01 mg/dL (ref 0.76–1.27)
Globulin, Total: 2.4 g/dL (ref 1.5–4.5)
Glucose: 125 mg/dL — ABNORMAL HIGH (ref 70–99)
Potassium: 3.5 mmol/L (ref 3.5–5.2)
Sodium: 140 mmol/L (ref 134–144)
Total Protein: 6.6 g/dL (ref 6.0–8.5)
eGFR: 80 mL/min/{1.73_m2} (ref >59)

## 2023-05-23 LAB — PSA, TOTAL AND FREE
PSA, Free Pct: 11 %
PSA, Free: 1.33 ng/mL
Prostate Specific Ag, Serum: 12.1 ng/mL — ABNORMAL HIGH (ref 0.0–4.0)

## 2023-05-23 LAB — CBC WITH DIFFERENTIAL/PLATELET
Basophils Absolute: 0 10*3/uL (ref 0.0–0.2)
Basos: 0 %
EOS (ABSOLUTE): 0.2 10*3/uL (ref 0.0–0.4)
Eos: 2 %
Hematocrit: 41 % (ref 37.5–51.0)
Hemoglobin: 13.8 g/dL (ref 13.0–17.7)
Immature Grans (Abs): 0 10*3/uL (ref 0.0–0.1)
Immature Granulocytes: 0 %
Lymphocytes Absolute: 1.2 10*3/uL (ref 0.7–3.1)
Lymphs: 15 %
MCH: 30.3 pg (ref 26.6–33.0)
MCHC: 33.7 g/dL (ref 31.5–35.7)
MCV: 90 fL (ref 79–97)
Monocytes Absolute: 0.5 10*3/uL (ref 0.1–0.9)
Monocytes: 7 %
Neutrophils Absolute: 6.1 10*3/uL (ref 1.4–7.0)
Neutrophils: 76 %
Platelets: 223 10*3/uL (ref 150–450)
RBC: 4.55 x10E6/uL (ref 4.14–5.80)
RDW: 11.8 % (ref 11.6–15.4)
WBC: 8 10*3/uL (ref 3.4–10.8)

## 2023-05-24 ENCOUNTER — Telehealth: Payer: Self-pay | Admitting: Family Medicine

## 2023-05-24 NOTE — Telephone Encounter (Signed)
 Copied from CRM [id number]. Topic: Appointments - Scheduling Inquiry for Clinic >> May 24, 2023  8:39 AM Ivette P wrote: Reason for CRM: Pt if flying out of the [place] and needs to see Dr. Zollie before leaving for medication. Next avail. App for Dr. Zollie isn't until Feb 3rd. Pt stated that is too far out. Pt is requesting to speak with DR Stacks or to be scheduled sooner. Pt requesting callback at Number [phone].

## 2023-05-24 NOTE — Telephone Encounter (Signed)
 Copied from CRM [id number]. Topic: General - Call Back - No Documentation >> May 24, 2023  1:13 PM Mosetta Putt H wrote: Reason for CRM: pt needs call back

## 2023-05-24 NOTE — Telephone Encounter (Signed)
 Left message to call back

## 2023-05-27 NOTE — Telephone Encounter (Signed)
Attempted to contact- NA  Need more information

## 2023-05-28 NOTE — Telephone Encounter (Signed)
 Duplicate message, patient has appointment in office 05/29/2023

## 2023-05-28 NOTE — Telephone Encounter (Signed)
 Duplicate message, patient has appointment in office tomorrow

## 2023-05-29 ENCOUNTER — Ambulatory Visit: Payer: No Typology Code available for payment source | Admitting: Family Medicine

## 2023-05-29 ENCOUNTER — Encounter: Payer: Self-pay | Admitting: Family Medicine

## 2023-05-29 VITALS — BP 163/70 | HR 67 | Temp 97.4°F | Ht 71.0 in | Wt 179.0 lb

## 2023-05-29 DIAGNOSIS — N529 Male erectile dysfunction, unspecified: Secondary | ICD-10-CM

## 2023-05-29 DIAGNOSIS — Z1211 Encounter for screening for malignant neoplasm of colon: Secondary | ICD-10-CM | POA: Diagnosis not present

## 2023-05-29 DIAGNOSIS — B070 Plantar wart: Secondary | ICD-10-CM

## 2023-05-29 DIAGNOSIS — I10 Essential (primary) hypertension: Secondary | ICD-10-CM | POA: Diagnosis not present

## 2023-05-29 DIAGNOSIS — E782 Mixed hyperlipidemia: Secondary | ICD-10-CM

## 2023-05-29 MED ORDER — ROSUVASTATIN CALCIUM 10 MG PO TABS: 10.0000 mg | ORAL_TABLET | Freq: Every day | ORAL | 3 refills | Status: DC

## 2023-05-29 MED ORDER — TADALAFIL 5 MG PO TABS: 5.0000 mg | ORAL_TABLET | Freq: Every day | ORAL | 3 refills | Status: DC

## 2023-05-29 MED ORDER — VALSARTAN-HYDROCHLOROTHIAZIDE 320-25 MG PO TABS: 1.0000 | ORAL_TABLET | Freq: Every day | ORAL | 3 refills | Status: DC

## 2023-05-29 MED ORDER — AMLODIPINE BESYLATE 5 MG PO TABS: 5.0000 mg | ORAL_TABLET | Freq: Every day | ORAL | 5 refills | Status: DC

## 2023-05-29 NOTE — Progress Notes (Signed)
 Subjective:  Patient ID: Raymond Vega, male    DOB: 1950/10/08  Age: 72 y.o. MRN: [id number]  CC: Medication Refill (pended) and Mass (Right foot growth. Slight discomfort when walking. )   HPI Raymond Vega presents for  presents for  follow-up of hypertension. Patient has no history of headache chest pain or shortness of breath or recent cough. Patient also denies symptoms of TIA such as focal numbness or weakness. Patient denies side effects from medication. States taking it regularly. [id number] at home.   Patient in for follow-up of GERD. Currently asymptomatic taking  PPI daily. There is no chest pain or heartburn. No hematemesis and no melena. No dysphagia or choking. Onset is remote. Progression is stable. Complicating factors, none.   in for follow-up of elevated cholesterol. Doing well without complaints on current medication. Denies side effects of statin including myalgia and arthralgia and nausea. Currently no chest pain, shortness of breath or other cardiovascular related symptoms noted.      05/29/2023   11:51 AM 12/31/2022   10:32 AM 12/31/2022   10:21 AM  Depression screen PHQ 2/9  Decreased Interest 0 0 0  Down, Depressed, Hopeless 1 1 0  PHQ - 2 Score 1 1 0  Altered sleeping 1 0   Tired, decreased energy 0 0   Change in appetite 0 0   Feeling bad or failure about yourself  1 0   Trouble concentrating 0 0   Moving slowly or fidgety/restless 0 0   Suicidal thoughts 0 0   PHQ-9 Score 3 1   Difficult doing work/chores Somewhat difficult Not difficult at all     History Raymond Vega has a past medical history of Dysrhythmia, GERD (gastroesophageal reflux disease), Hypertension, and Prostate cancer (HCC).   He has a past surgical history that includes Nasal fracture surgery; Fracture surgery; Circumcision; Band hemorrhoidectomy; Olecranon bursectomy (Right, 11/18/2015); Nerve and artery repair (Left, 12/24/2016); THUMB SURGERY (Left); LEFT FINGER SURGERY (Left); and  Shoulder arthroscopy (Left, 01/15/2023).   His family history includes Heart attack (age of onset: 2) in his brother; Pneumonia in his father.He reports that he has never smoked. He has never used smokeless tobacco. He reports current alcohol use. He reports that he does not use drugs.    ROS Review of Systems  Constitutional:  Negative for fever.  Respiratory:  Negative for shortness of breath.   Cardiovascular:  Negative for chest pain.  Genitourinary:        Erectile dysfuction tx adequate with daily tadalafil . Occasional failure though    Musculoskeletal:  Negative for arthralgias.  Skin:  Negative for rash.       Lesion ball of right foot. Treated last visit with liquid nitrogen, but had no reaction    Objective:  BP (!) 163/70   Pulse 67   Temp (!) 97.4 F (36.3 C) (Temporal)   Ht 5\' 11"  (1.803 m)   Wt 179 lb (81.2 kg)   SpO2 100%   BMI 24.97 kg/m   BP Readings from Last 3 Encounters:  05/29/23 (!) 163/70  05/22/23 136/69  01/15/23 (!) 143/91    Wt Readings from Last 3 Encounters:  05/29/23 179 lb (81.2 kg)  05/22/23 180 lb 6.4 oz (81.8 kg)  01/15/23 167 lb (75.8 kg)     Physical Exam Vitals reviewed.  Constitutional:      Appearance: He is well-developed.  HENT:     Head: Normocephalic and atraumatic.     Right Ear: External ear normal.  Left Ear: External ear normal.     Mouth/Throat:     Pharynx: No oropharyngeal exudate or posterior oropharyngeal erythema.  Eyes:     Pupils: Pupils are equal, round, and reactive to light.  Cardiovascular:     Rate and Rhythm: Normal rate and regular rhythm.     Heart sounds: No murmur heard. Pulmonary:     Effort: No respiratory distress.     Breath sounds: Normal breath sounds.  Musculoskeletal:     Cervical back: Normal range of motion and neck supple.  Skin:    Findings: Lesion (ball of right foot, 4 mm plantar wart. Showing no sign of having been previously treated.) present.  Neurological:     Mental  Status: He is alert and oriented to person, place, and time.       Assessment & Plan:   Raymond Vega was seen today for medication refill and mass.  Diagnoses and all orders for this visit:  Mixed hyperlipidemia -     rosuvastatin  (CRESTOR ) 10 MG tablet; Take 1 tablet (10 mg total) by mouth daily. for cholesterol  Screen for colon cancer -     Ambulatory referral to Gastroenterology  Plantar wart of left foot  Primary hypertension -     valsartan -hydrochlorothiazide  (DIOVAN -HCT) 320-25 MG tablet; Take 1 tablet by mouth daily. -     amLODipine  (NORVASC ) 5 MG tablet; Take 1 tablet (5 mg total) by mouth daily. For blood pressure  Erectile dysfunction, unspecified erectile dysfunction type -     tadalafil  (CIALIS ) 5 MG tablet; Take 1 tablet (5 mg total) by mouth daily.       I am having Raymond Vega start on amLODipine . I am also having him maintain his aspirin EC, fluticasone , vitamin C, calcium  carbonate, acetaminophen , melatonin, omeprazole , rosuvastatin , valsartan -hydrochlorothiazide , and tadalafil .  Allergies as of 05/29/2023   No Known Allergies      Medication List        Accurate as of May 29, 2023  8:58 PM. If you have any questions, ask your nurse or doctor.          acetaminophen  500 MG tablet Commonly known as: TYLENOL  Take 1,000 mg by mouth every 4 (four) hours as needed for moderate pain.   amLODipine  5 MG tablet Commonly known as: NORVASC  Take 1 tablet (5 mg total) by mouth daily. For blood pressure Started by: Vallorie Gayer Conor Filsaime   aspirin EC 81 MG tablet Take 81 mg by mouth daily. Swallow whole.   calcium  carbonate 1250 (500 Ca) MG chewable tablet Commonly known as: OS-CAL Chew 1 tablet by mouth every 3 (three) days.   fluticasone  50 MCG/ACT nasal spray Commonly known as: FLONASE  Place 2 sprays into both nostrils daily. What changed:  when to take this reasons to take this   melatonin 5 MG Tabs Take 5 mg by mouth at bedtime as needed  (sleep).   omeprazole  20 MG capsule Commonly known as: PRILOSEC Take 1 capsule (20 mg total) by mouth daily.   rosuvastatin  10 MG tablet Commonly known as: CRESTOR  Take 1 tablet (10 mg total) by mouth daily. for cholesterol   tadalafil  5 MG tablet Commonly known as: CIALIS  Take 1 tablet (5 mg total) by mouth daily.   valsartan -hydrochlorothiazide  320-25 MG tablet Commonly known as: DIOVAN -HCT Take 1 tablet by mouth daily.   vitamin C 1000 MG tablet Take 2,000-3,000 mg by mouth daily as needed (cold symptoms).         Follow-up: Return in about 3 months (  around 08/27/2023) for hypertension.  Roise Cleaver, M.D.

## 2023-05-30 DIAGNOSIS — H5203 Hypermetropia, bilateral: Secondary | ICD-10-CM | POA: Diagnosis not present

## 2023-06-11 ENCOUNTER — Other Ambulatory Visit: Payer: Self-pay | Admitting: *Deleted

## 2023-06-11 DIAGNOSIS — I10 Essential (primary) hypertension: Secondary | ICD-10-CM

## 2023-06-11 MED ORDER — AMLODIPINE BESYLATE 5 MG PO TABS: 5.0000 mg | ORAL_TABLET | Freq: Every day | ORAL | 1 refills | Status: DC

## 2023-06-12 ENCOUNTER — Other Ambulatory Visit: Payer: Self-pay | Admitting: Family Medicine

## 2023-06-12 DIAGNOSIS — I10 Essential (primary) hypertension: Secondary | ICD-10-CM

## 2023-06-12 NOTE — Telephone Encounter (Signed)
Copied from CRM [id number]. Topic: Clinical - Medication Refill >> Jun 12, 2023  1:57 PM Carlatta H wrote: Most Recent Primary Care Visit:  Provider: Mechele Claude  Department: [facility]  Visit Type: OFFICE VISIT  Date: 05/29/2023  Medication: amLODipine (NORVASC) 5 MG tablet [[id number]]  Has the patient contacted their pharmacy? Yes (Agent: If no, request that the patient contact the pharmacy for the refill. If patient does not wish to contact the pharmacy document the reason why and proceed with request.) (Agent: If yes, when and what did the pharmacy advise?) Patient needs a 86month supply due to going out of country  Is this the correct pharmacy for this prescription? Yes If no, delete pharmacy and type the correct one.  This is the patient's preferred pharmacy:      [facility] - [place], [place] - [address] Portal to [facility] [employer] [address] [place] [address] Phone: [phone] Fax: [phone]     Has the prescription been filled recently? No  Is the patient out of the medication? No  Has the patient been seen for an appointment in the last year OR does the patient have an upcoming appointment? Yes  Can we respond through MyChart? Yes  Agent: Please be advised that Rx refills may take up to 3 business days. We ask that you follow-up with your pharmacy.

## 2023-07-01 ENCOUNTER — Telehealth: Payer: Self-pay

## 2023-07-01 NOTE — Telephone Encounter (Signed)
 Please order

## 2023-07-01 NOTE — Telephone Encounter (Signed)
 Copied from CRM [id number]. Topic: Clinical - Request for Lab/Test Order >> Jul 01, 2023 11:02 AM Raymond Vega wrote: Reason for CRM: Patient needs orders placed for PSA blood work. The urologist says they have been requesting it since early Feb. Pt will be traveling overseas so this needs to be done immediately. Please f/u

## 2023-07-02 NOTE — Telephone Encounter (Signed)
 PSA ordered and scheduled. LS

## 2023-07-03 ENCOUNTER — Other Ambulatory Visit: Payer: No Typology Code available for payment source

## 2023-07-03 DIAGNOSIS — C61 Malignant neoplasm of prostate: Secondary | ICD-10-CM

## 2023-07-04 ENCOUNTER — Other Ambulatory Visit: Payer: No Typology Code available for payment source

## 2023-07-04 LAB — PSA, TOTAL AND FREE
PSA, Free Pct: 11.3 %
PSA, Free: 1.51 ng/mL
Prostate Specific Ag, Serum: 13.4 ng/mL — ABNORMAL HIGH (ref 0.0–4.0)

## 2023-07-29 ENCOUNTER — Telehealth: Payer: Self-pay

## 2023-07-29 NOTE — Telephone Encounter (Signed)
 Received approval fax for medical services. Approval letter attached to chart.

## 2023-08-22 DIAGNOSIS — C61 Malignant neoplasm of prostate: Secondary | ICD-10-CM | POA: Diagnosis not present

## 2023-08-23 ENCOUNTER — Telehealth: Payer: Self-pay | Admitting: Family Medicine

## 2023-08-26 ENCOUNTER — Telehealth: Payer: Self-pay | Admitting: Cardiology

## 2023-08-26 NOTE — Telephone Encounter (Signed)
 Order CBC, CMP, Lipid, PSA, Vit. D, Urinalysis dip

## 2023-08-26 NOTE — Telephone Encounter (Signed)
 Patient called and said that he went out of country and had irregular heart rate and went to hospital. Was told he needs to see cardiologist asap. Please give patient a call back to discuss

## 2023-08-26 NOTE — Telephone Encounter (Signed)
 Spoke with pt, he has the ECG strip that was taken while he was there. He spent one night in the hospital for treatment and he checked out AMA, he was in [place]. He does have a copy of lab work that was taken as well. Follow up scheduled with DOD tomorrow, dr Emmette Harms.

## 2023-08-27 ENCOUNTER — Ambulatory Visit: Attending: Cardiology | Admitting: Cardiology

## 2023-08-27 ENCOUNTER — Ambulatory Visit (INDEPENDENT_AMBULATORY_CARE_PROVIDER_SITE_OTHER)

## 2023-08-27 ENCOUNTER — Encounter: Payer: Self-pay | Admitting: Cardiology

## 2023-08-27 ENCOUNTER — Other Ambulatory Visit: Payer: Self-pay

## 2023-08-27 VITALS — BP 120/74 | HR 67 | Ht 71.0 in | Wt 182.0 lb

## 2023-08-27 DIAGNOSIS — Z125 Encounter for screening for malignant neoplasm of prostate: Secondary | ICD-10-CM

## 2023-08-27 DIAGNOSIS — I38 Endocarditis, valve unspecified: Secondary | ICD-10-CM

## 2023-08-27 DIAGNOSIS — I480 Paroxysmal atrial fibrillation: Secondary | ICD-10-CM | POA: Diagnosis not present

## 2023-08-27 DIAGNOSIS — E782 Mixed hyperlipidemia: Secondary | ICD-10-CM

## 2023-08-27 DIAGNOSIS — N401 Enlarged prostate with lower urinary tract symptoms: Secondary | ICD-10-CM

## 2023-08-27 DIAGNOSIS — I10 Essential (primary) hypertension: Secondary | ICD-10-CM

## 2023-08-27 MED ORDER — DILTIAZEM HCL ER COATED BEADS 120 MG PO CP24: 120.0000 mg | ORAL_CAPSULE | Freq: Every day | ORAL | 3 refills | Status: DC

## 2023-08-27 NOTE — Telephone Encounter (Signed)
 Future orders placed. Patient notified and lab appt made

## 2023-08-27 NOTE — Progress Notes (Unsigned)
 Cardiology Office Note:    Date:  08/28/2023   ID:  Raymond Vega, DOB 08-30-1950, MRN [id number]  PCP:  Roise Cleaver, MD  Cardiologist:  Eilleen Grates, MD  Electrophysiologist:  None   Referring MD: Roise Cleaver, MD   " I had an episode of PAF in the [place]"i  History of Present Illness:    Raymond Vega is a 73 y.o. male with a hx of hypertension, GERD, PAF not on [facility] here today for acute visit due to an episode of afib.   He follows with Dr. Danley Dusky and was seen on 08/30/2021.  He presents after a recent episode of chest pain and anxiety upon arrival in [place] in February. He reports that he was evaluated and an EKG was performed, which showed AFib. However, he declined treatment at the time and decided to wait [place]. He reports that this is the third such incident of AFib he has experienced. He also reports having low potassium levels at the time of the previous incident.  The patient also mentions that he has prostate cancer and is due to receive treatment soon. He expresses concern about how this might affect his travel plans. He also reports that he has been monitoring his blood pressure at home and has noticed that it has been high. He is currently taking amlodipine for blood pressure control.  Past Medical History:  Diagnosis Date   Dysrhythmia    GERD (gastroesophageal reflux disease)    Hypertension    Prostate cancer (HCC)     Past Surgical History:  Procedure Laterality Date   BAND HEMORRHOIDECTOMY     CIRCUMCISION     FRACTURE SURGERY     LEFT FINGER SURGERY Left    INDEX, MIDDLE, LONG FINGERS   NASAL FRACTURE SURGERY     NERVE AND ARTERY REPAIR Left 12/24/2016   Procedure: EXPLORATION WITH REPAIRS AS NECESSARY LACERATION HAND;  Surgeon: Florida Hurter, MD;  Location: [facility] OR;  Service: Orthopedics;  Laterality: Left;   OLECRANON BURSECTOMY Right 11/18/2015   Procedure: RIGHT ELBOW BURSECTOMY AND OLECRANON SPUR EXCISION;   Surgeon: Dayne Even, MD;  Location: [facility];  Service: Orthopedics;  Laterality: Right;   SHOULDER ARTHROSCOPY Left 01/15/2023   Procedure: LEFT SHOULDER ARTHROSCOPY, ACROMIOPLASTY AND DEBRIDEMENT;  Surgeon: Dayne Even, MD;  Location: [facility];  Service: Orthopedics;  Laterality: Left;   THUMB SURGERY Left     Current Medications: Current Meds  Medication Sig   acetaminophen (TYLENOL) 500 MG tablet Take 1,000 mg by mouth every 4 (four) hours as needed for moderate pain.   Ascorbic Acid (VITAMIN C) 1000 MG tablet Take 2,000-3,000 mg by mouth daily as needed (cold symptoms).   aspirin EC 81 MG tablet Take 81 mg by mouth daily. Swallow whole.   calcium carbonate (OS-CAL) 1250 (500 Ca) MG chewable tablet Chew 1 tablet by mouth every 3 (three) days.   fluticasone (FLONASE) 50 MCG/ACT nasal spray Place 2 sprays into both nostrils daily. (Patient taking differently: Place 2 sprays into both nostrils daily as needed for allergies.)   melatonin 5 MG TABS Take 5 mg by mouth at bedtime as needed (sleep).   omeprazole (PRILOSEC) 20 MG capsule Take 1 capsule (20 mg total) by mouth daily.   rosuvastatin (CRESTOR) 10 MG tablet Take 1 tablet (10 mg total) by mouth daily. for cholesterol   tadalafil (CIALIS) 5 MG tablet Take 1 tablet (5 mg total) by mouth daily.   valsartan-hydrochlorothiazide (DIOVAN-HCT) 320-25  MG tablet Take 1 tablet by mouth daily.   [DISCONTINUED] amLODipine (NORVASC) 5 MG tablet Take 1 tablet (5 mg total) by mouth daily. For blood pressure   [DISCONTINUED] diltiazem (CARDIZEM CD) 120 MG 24 hr capsule Take 1 capsule (120 mg total) by mouth daily.     Allergies:   Patient has no known allergies.   Social History   Socioeconomic History   Marital status: Divorced    Spouse name: Not on file   Number of children: 3   Years of education: Not on file   Highest education level: Associate degree: occupational, Scientist, product/process development, or vocational program  Occupational  History   Occupation: SELF EMPLOYED    Comment: SEM[employer] RETIRED  Tobacco Use   Smoking status: Never   Smokeless tobacco: Never  Vaping Use   Vaping status: Never Used  Substance and Sexual Activity   Alcohol use: Yes    Comment: rarely   Drug use: No   Sexual activity: Not on file  Other Topics Concern   Not on file  Social History Narrative   Not on file   Social Drivers of Health   Financial Resource Strain: Low Risk  (05/18/2023)   Overall Financial Resource Strain (CARDIA)    Difficulty of Paying Living Expenses: Not hard at all  Food Insecurity: No Food Insecurity (05/18/2023)   Hunger Vital Sign    Worried About Running Out of Food in the Last Year: Never true    Ran Out of Food in the Last Year: Never true  Transportation Needs: No Transportation Needs (05/18/2023)   PRAPARE - Administrator, Civil Service (Medical): No    Lack of Transportation (Non-Medical): No  Physical Activity: Sufficiently Active (05/18/2023)   Exercise Vital Sign    Days of Exercise per Week: 3 days    Minutes of Exercise per Session: 60 min  Stress: No Stress Concern Present (05/18/2023)   [employer] of Occupational Health - Occupational Stress Questionnaire    Feeling of Stress : Only a little  Social Connections: Moderately Integrated (05/18/2023)   Social Connection and Isolation Panel [NHANES]    Frequency of Communication with Friends and Family: More than three times a week    Frequency of Social Gatherings with Friends and Family: More than three times a week    Attends Religious Services: More than 4 times per year    Active Member of [employer] or Organizations: Yes    Attends Engineer, structural: More than 4 times per year    Marital Status: Divorced     Family History: The patient's family history includes Heart attack (age of onset: 59) in his brother; Pneumonia in his father.  ROS:   Review of Systems  Constitution: Negative for decreased appetite, fever and  weight gain.  HENT: Negative for congestion, ear discharge, hoarse voice and sore throat.   Eyes: Negative for discharge, redness, vision loss in right eye and visual halos.  Cardiovascular: Negative for chest pain, dyspnea on exertion, leg swelling, orthopnea and palpitations.  Respiratory: Negative for cough, hemoptysis, shortness of breath and snoring.   Endocrine: Negative for heat intolerance and polyphagia.  Hematologic/Lymphatic: Negative for bleeding problem. Does not bruise/bleed easily.  Skin: Negative for flushing, nail changes, rash and suspicious lesions.  Musculoskeletal: Negative for arthritis, joint pain, muscle cramps, myalgias, neck pain and stiffness.  Gastrointestinal: Negative for abdominal pain, bowel incontinence, diarrhea and excessive appetite.  Genitourinary: Negative for decreased libido, genital sores and incomplete emptying.  Neurological: Negative for brief paralysis, focal weakness, headaches and loss of balance.  Psychiatric/Behavioral: Negative for altered mental status, depression and suicidal ideas.  Allergic/Immunologic: Negative for HIV exposure and persistent infections.    EKGs/Labs/Other Studies Reviewed:    The following studies were reviewed today:   EKG:  The ekg ordered today demonstrates sinus rhythm with sinus arrhythmia, HR 64 bpm  Recent Labs: 05/22/2023: ALT 22; BUN 20; Creatinine, Ser 1.01; Hemoglobin 13.8; Platelets 223; Potassium 3.5; Sodium 140  Recent Lipid Panel    Component Value Date/Time   CHOL 140 05/22/2023 1634   TRIG 59 05/22/2023 1634   HDL 60 05/22/2023 1634   CHOLHDL 2.3 05/22/2023 1634   LDLCALC 68 05/22/2023 1634    Physical Exam:    VS:  BP 120/74 (BP Location: Right Arm, Patient Position: Sitting, Cuff Size: Normal)   Pulse 67   Ht 5\' 11"  (1.803 m)   Wt 182 lb (82.6 kg)   SpO2 97%   BMI 25.38 kg/m     Wt Readings from Last 3 Encounters:  08/27/23 182 lb (82.6 kg)  05/29/23 179 lb (81.2 kg)  05/22/23 180  lb 6.4 oz (81.8 kg)     GEN: Well nourished, well developed in no acute distress HEENT: Normal NECK: No JVD; No carotid bruits LYMPHATICS: No lymphadenopathy CARDIAC: S1S2 noted,RRR, 3/6 holosystolic murmurs, rubs, gallops RESPIRATORY:  Clear to auscultation without rales, wheezing or rhonchi  ABDOMEN: Soft, non-tender, non-distended, +bowel sounds, no guarding. EXTREMITIES: No edema, No cyanosis, no clubbing MUSCULOSKELETAL:  No deformity  SKIN: Warm and dry NEUROLOGIC:  Alert and oriented x 3, non-focal PSYCHIATRIC:  Normal affect, good insight  ASSESSMENT:    1. PAF (paroxysmal atrial fibrillation) (HCC)   2. Murmur, diastolic    PLAN:    PAF- EKG reviewed from [place] he was in afib but the next day EKG showed sinus rhythm. This was in feb 2025.  Switch from amlodipine to Cardizem for heart rate control. - Discuss initiation of anticoagulation for stroke prevention due to [facility] score of 2 (Eliquis or Xarelto) he prefers to wait at this time.  Will place monitor on the patient to get information on PAF burden.  Order echocardiogram to assess structural heart changes.  Hypertension Hypertension with recent elevated readings, currently controlled on amlodipine. - Switch from amlodipine to Cardizem for blood pressure and heart rate control.  Heart Murmur Detected heart murmur, possibly due to calcification. - Order echocardiogram to evaluate heart murmur and check for calcification.  Follow-up - Schedule follow-up with primary cardiologist in 8-12 weeks after heart monitor results are available.  The patient is in agreement with the above plan. The patient left the office in stable condition.  The patient will follow up in   Medication Adjustments/Labs and Tests Ordered: Current medicines are reviewed at length with the patient today.  Concerns regarding medicines are outlined above.  Orders Placed This Encounter  Procedures   LONG TERM MONITOR (3-14 DAYS)   EKG  12-Lead   ECHOCARDIOGRAM COMPLETE   Meds ordered this encounter  Medications   DISCONTD: diltiazem (CARDIZEM CD) 120 MG 24 hr capsule    Sig: Take 1 capsule (120 mg total) by mouth daily.    Dispense:  90 capsule    Refill:  3   diltiazem (CARDIZEM CD) 120 MG 24 hr capsule    Sig: Take 1 capsule (120 mg total) by mouth daily.    Dispense:  90 capsule    Refill:  3  Patient Instructions  Medication Instructions:  Your physician has recommended you make the following change in your medication:  STOP: Amlodipine  START: Cardizem 120 mg once daily *If you need a refill on your cardiac medications before your next appointment, please call your pharmacy*  Testing/Procedures: Your physician has requested that you have an echocardiogram. Echocardiography is a painless test that uses sound waves to create images of your heart. It provides your doctor with information about the size and shape of your heart and how well your heart's chambers and valves are working. This procedure takes approximately one hour. There are no restrictions for this procedure. Please do NOT wear cologne, perfume, aftershave, or lotions (deodorant is allowed). Please arrive 15 minutes prior to your appointment time.  Please note: We ask at that you not bring children with you during ultrasound (echo/ vascular) testing. Due to room size and safety concerns, children are not allowed in the ultrasound rooms during exams. Our front office staff cannot provide observation of children in our lobby area while testing is being conducted. An adult accompanying a patient to their appointment will only be allowed in the ultrasound room at the discretion of the ultrasound technician under special circumstances. We apologize for any inconvenience.  ZIO XT- Long Term Monitor Instructions  Your physician has requested you wear a ZIO patch monitor for 14 days.  This is a single patch monitor. [employer] supplies one patch monitor per  enrollment. Additional stickers are not available. Please do not apply patch if you will be having a Nuclear Stress Test,  Echocardiogram, Cardiac CT, MRI, or Chest Xray during the period you would be wearing the  monitor. The patch cannot be worn during these tests. You cannot remove and re-apply the  ZIO XT patch monitor.  Your ZIO patch monitor will be mailed 3 day USPS to your address on file. It may take 3-5 days  to receive your monitor after you have been enrolled.  Once you have received your monitor, please review the enclosed instructions. Your monitor  has already been registered assigning a specific monitor serial # to you.  Billing and Patient Assistance Program Information  We have supplied [employer] with any of your insurance information on file for billing purposes. [employer] offers a sliding scale Patient Assistance Program for patients that do not have  insurance, or whose insurance does not completely cover the cost of the ZIO monitor.  You must apply for the Patient Assistance Program to qualify for this discounted rate.  To apply, please call [employer] at [phone], select option 4, select option 2, ask to apply for  Patient Assistance Program. Sanna Crystal will ask your household income, and how many people  are in your household. They will quote your out-of-pocket cost based on that information.  [employer] will also be able to set up a 90-month, interest-free payment plan if needed.  Applying the monitor   Shave hair from upper left chest.  Hold abrader disc by orange tab. Rub abrader in 40 strokes over the upper left chest as  indicated in your monitor instructions.  Clean area with 4 enclosed alcohol pads. Let dry.  Apply patch as indicated in monitor instructions. Patch will be placed under collarbone on left  side of chest with arrow pointing upward.  Rub patch adhesive wings for 2 minutes. Remove white label marked "1". Remove the white  label marked "2". Rub patch  adhesive wings for 2 additional minutes.  While looking in a mirror, press and  release button in center of patch. A small green light will  flash 3-4 times. This will be your only indicator that the monitor has been turned on.  Do not shower for the first 24 hours. You may shower after the first 24 hours.  Press the button if you feel a symptom. You will hear a small click. Record Date, Time and  Symptom in the Patient Logbook.  When you are ready to remove the patch, follow instructions on the last 2 pages of Patient  Logbook. Stick patch monitor onto the last page of Patient Logbook.  Place Patient Logbook in the blue and white box. Use locking tab on box and tape box closed  securely. The blue and white box has prepaid postage on it. Please place it in the mailbox as  soon as possible. Your physician should have your test results approximately 7 days after the  monitor has been mailed back to [facility].  Call [facility] Customer Care at [phone] if you have questions regarding  your ZIO XT patch monitor. Call them immediately if you see an orange light blinking on your  monitor.  If your monitor falls off in less than 4 days, contact our Monitor department at [phone].  If your monitor becomes loose or falls off after 4 days call [employer] at [phone] for  suggestions on securing your monitor   Follow-Up: At [facility], you and your health needs are our priority.  As part of our continuing mission to provide you with exceptional heart care, our providers are all part of one team.  This team includes your primary Cardiologist (physician) and Advanced Practice Providers or APPs (Physician Assistants and Nurse Practitioners) who all work together to provide you with the care you need, when you need it.  Your next appointment:   8-12 week(s)  Provider:   Eilleen Grates, MD    Other Instructions:   1st Floor: - Lobby - Registration  - Pharmacy  -  Lab - Cafe  2nd Floor: - PV Lab - Diagnostic Testing (echo, CT, nuclear med)  3rd Floor: - Vacant  4th Floor: - TCTS (cardiothoracic surgery) - AFib Clinic - Structural Heart Clinic - Vascular Surgery  - Vascular Ultrasound  5th Floor: - HeartCare Cardiology (general and EP) - Clinical Pharmacy for coumadin, hypertension, lipid, weight-loss medications, and med management appointments    Valet parking services will be available as well.      Adopting a Healthy Lifestyle.  Know what a healthy weight is for you (roughly BMI <25) and aim to maintain this   Aim for 7+ servings of fruits and vegetables daily   65-80+ fluid ounces of water or unsweet tea for healthy kidneys   Limit to max 1 drink of alcohol per day; avoid smoking/tobacco   Limit animal fats in diet for cholesterol and heart health - choose grass fed whenever available   Avoid highly processed foods, and foods high in saturated/trans fats   Aim for low stress - take time to unwind and care for your mental health   Aim for 150 min of moderate intensity exercise weekly for heart health, and weights twice weekly for bone health   Aim for 7-9 hours of sleep daily   When it comes to diets, agreement about the perfect plan isnt easy to find, even among the experts. Experts at the [facility] of [employer] developed an idea known as the Healthy Eating Plate. Just imagine a plate divided into logical,  healthy portions.   The emphasis is on diet quality:   Load up on vegetables and fruits - one-half of your plate: Aim for color and variety, and remember that potatoes dont count.   Go for whole grains - one-quarter of your plate: Whole wheat, barley, wheat berries, quinoa, oats, brown rice, and foods made with them. If you want pasta, go with whole wheat pasta.   Protein power - one-quarter of your plate: Fish, chicken, beans, and nuts are all healthy, versatile protein sources. Limit red meat.   The  diet, however, does go beyond the plate, offering a few other suggestions.   Use healthy plant oils, such as olive, canola, soy, corn, sunflower and peanut. Check the labels, and avoid partially hydrogenated oil, which have unhealthy trans fats.   If youre thirsty, drink water. Coffee and tea are good in moderation, but skip sugary drinks and limit milk and dairy products to one or two daily servings.   The type of carbohydrate in the diet is more important than the amount. Some sources of carbohydrates, such as vegetables, fruits, whole grains, and beans-are healthier than others.   Finally, stay active  Signed, Reylene Stauder, DO  08/28/2023 4:57 PM    [facility]

## 2023-08-27 NOTE — Progress Notes (Unsigned)
 Enrolled for [employer] to mail a ZIO XT long term holter monitor to the patients address on file.

## 2023-08-27 NOTE — Patient Instructions (Addendum)
 Medication Instructions:  Your physician has recommended you make the following change in your medication:  STOP: Amlodipine  START: Cardizem 120 mg once daily *If you need a refill on your cardiac medications before your next appointment, please call your pharmacy*  Testing/Procedures: Your physician has requested that you have an echocardiogram. Echocardiography is a painless test that uses sound waves to create images of your heart. It provides your doctor with information about the size and shape of your heart and how well your heart's chambers and valves are working. This procedure takes approximately one hour. There are no restrictions for this procedure. Please do NOT wear cologne, perfume, aftershave, or lotions (deodorant is allowed). Please arrive 15 minutes prior to your appointment time.  Please note: We ask at that you not bring children with you during ultrasound (echo/ vascular) testing. Due to room size and safety concerns, children are not allowed in the ultrasound rooms during exams. Our front office staff cannot provide observation of children in our lobby area while testing is being conducted. An adult accompanying a patient to their appointment will only be allowed in the ultrasound room at the discretion of the ultrasound technician under special circumstances. We apologize for any inconvenience.  ZIO XT- Long Term Monitor Instructions  Your physician has requested you wear a ZIO patch monitor for 14 days.  This is a single patch monitor. [employer] supplies one patch monitor per enrollment. Additional stickers are not available. Please do not apply patch if you will be having a Nuclear Stress Test,  Echocardiogram, Cardiac CT, MRI, or Chest Xray during the period you would be wearing the  monitor. The patch cannot be worn during these tests. You cannot remove and re-apply the  ZIO XT patch monitor.  Your ZIO patch monitor will be mailed 3 day USPS to your address on file. It  may take 3-5 days  to receive your monitor after you have been enrolled.  Once you have received your monitor, please review the enclosed instructions. Your monitor  has already been registered assigning a specific monitor serial # to you.  Billing and Patient Assistance Program Information  We have supplied [employer] with any of your insurance information on file for billing purposes. [employer] offers a sliding scale Patient Assistance Program for patients that do not have  insurance, or whose insurance does not completely cover the cost of the ZIO monitor.  You must apply for the Patient Assistance Program to qualify for this discounted rate.  To apply, please call [employer] at [phone], select option 4, select option 2, ask to apply for  Patient Assistance Program. Sanna Crystal will ask your household income, and how many people  are in your household. They will quote your out-of-pocket cost based on that information.  [employer] will also be able to set up a 42-month, interest-free payment plan if needed.  Applying the monitor   Shave hair from upper left chest.  Hold abrader disc by orange tab. Rub abrader in 40 strokes over the upper left chest as  indicated in your monitor instructions.  Clean area with 4 enclosed alcohol pads. Let dry.  Apply patch as indicated in monitor instructions. Patch will be placed under collarbone on left  side of chest with arrow pointing upward.  Rub patch adhesive wings for 2 minutes. Remove white label marked "1". Remove the white  label marked "2". Rub patch adhesive wings for 2 additional minutes.  While looking in a mirror, press and release button in center  of patch. A small green light will  flash 3-4 times. This will be your only indicator that the monitor has been turned on.  Do not shower for the first 24 hours. You may shower after the first 24 hours.  Press the button if you feel a symptom. You will hear a small click. Record Date, Time and  Symptom  in the Patient Logbook.  When you are ready to remove the patch, follow instructions on the last 2 pages of Patient  Logbook. Stick patch monitor onto the last page of Patient Logbook.  Place Patient Logbook in the blue and white box. Use locking tab on box and tape box closed  securely. The blue and white box has prepaid postage on it. Please place it in the mailbox as  soon as possible. Your physician should have your test results approximately 7 days after the  monitor has been mailed back to [facility].  Call [facility] Customer Care at [phone] if you have questions regarding  your ZIO XT patch monitor. Call them immediately if you see an orange light blinking on your  monitor.  If your monitor falls off in less than 4 days, contact our Monitor department at [phone].  If your monitor becomes loose or falls off after 4 days call [employer] at [phone] for  suggestions on securing your monitor   Follow-Up: At [facility], you and your health needs are our priority.  As part of our continuing mission to provide you with exceptional heart care, our providers are all part of one team.  This team includes your primary Cardiologist (physician) and Advanced Practice Providers or APPs (Physician Assistants and Nurse Practitioners) who all work together to provide you with the care you need, when you need it.  Your next appointment:   8-12 week(s)  Provider:   Eilleen Grates, MD    Other Instructions:   1st Floor: - Lobby - Registration  - Pharmacy  - Lab - Cafe  2nd Floor: - PV Lab - Diagnostic Testing (echo, CT, nuclear med)  3rd Floor: - Vacant  4th Floor: - TCTS (cardiothoracic surgery) - AFib Clinic - Structural Heart Clinic - Vascular Surgery  - Vascular Ultrasound  5th Floor: - HeartCare Cardiology (general and EP) - Clinical Pharmacy for coumadin, hypertension, lipid, weight-loss medications, and med management  appointments    Valet parking services will be available as well.

## 2023-08-27 NOTE — Telephone Encounter (Signed)
 Pt called in to speak with nurse about his appt today.

## 2023-08-27 NOTE — Telephone Encounter (Signed)
 Patient identification verified by 2 forms. Raymond Lucky, RN    Called and spoke to patient  Patient states:   -has another appointment at 12pm in [place]   -would like to come in later to see Dr. Emmette Harms at 2pm  Informed patient multiple times that at this time there is no later appointment available  Patient verbalized understanding, states he will cancel his other appointment  Patient has no further questions at this time

## 2023-08-28 ENCOUNTER — Ambulatory Visit: Payer: No Typology Code available for payment source | Admitting: Family Medicine

## 2023-08-28 ENCOUNTER — Other Ambulatory Visit

## 2023-08-28 DIAGNOSIS — I10 Essential (primary) hypertension: Secondary | ICD-10-CM | POA: Diagnosis not present

## 2023-08-28 DIAGNOSIS — Z125 Encounter for screening for malignant neoplasm of prostate: Secondary | ICD-10-CM

## 2023-08-28 DIAGNOSIS — E782 Mixed hyperlipidemia: Secondary | ICD-10-CM

## 2023-08-28 DIAGNOSIS — N401 Enlarged prostate with lower urinary tract symptoms: Secondary | ICD-10-CM | POA: Diagnosis not present

## 2023-08-28 DIAGNOSIS — R350 Frequency of micturition: Secondary | ICD-10-CM | POA: Diagnosis not present

## 2023-08-28 LAB — URINALYSIS, ROUTINE W REFLEX MICROSCOPIC
Bilirubin, UA: NEGATIVE
Glucose, UA: NEGATIVE
Ketones, UA: NEGATIVE
Leukocytes,UA: NEGATIVE
Nitrite, UA: NEGATIVE
Protein,UA: NEGATIVE
Specific Gravity, UA: 1.015 (ref 1.005–1.030)
Urobilinogen, Ur: 0.2 mg/dL (ref 0.2–1.0)
pH, UA: 7 (ref 5.0–7.5)

## 2023-08-28 LAB — LIPID PANEL

## 2023-08-28 LAB — MICROSCOPIC EXAMINATION
Bacteria, UA: NONE SEEN
Epithelial Cells (non renal): NONE SEEN /HPF (ref 0–10)
RBC, Urine: NONE SEEN /HPF (ref 0–2)
Renal Epithel, UA: NONE SEEN /HPF
WBC, UA: NONE SEEN /HPF (ref 0–5)
Yeast, UA: NONE SEEN

## 2023-08-28 MED ORDER — DILTIAZEM HCL ER COATED BEADS 120 MG PO CP24: 120.0000 mg | ORAL_CAPSULE | Freq: Every day | ORAL | 3 refills | Status: DC

## 2023-08-29 LAB — CMP14+EGFR
ALT: 22 IU/L (ref 0–44)
AST: 19 IU/L (ref 0–40)
Albumin: 4.4 g/dL (ref 3.9–4.9)
Alkaline Phosphatase: 71 IU/L (ref 44–121)
BUN/Creatinine Ratio: 20 (ref 10–24)
BUN: 17 mg/dL (ref 8–27)
Bilirubin Total: 0.9 mg/dL (ref 0.0–1.2)
CO2: 26 mmol/L (ref 20–29)
Calcium: 9.2 mg/dL (ref 8.6–10.2)
Chloride: 99 mmol/L (ref 96–106)
Creatinine, Ser: 0.86 mg/dL (ref 0.76–1.27)
Globulin, Total: 2.8 g/dL (ref 1.5–4.5)
Glucose: 86 mg/dL (ref 70–99)
Potassium: 3.6 mmol/L (ref 3.5–5.2)
Sodium: 140 mmol/L (ref 134–144)
Total Protein: 7.2 g/dL (ref 6.0–8.5)
eGFR: 93 mL/min/{1.73_m2} (ref >59)

## 2023-08-29 LAB — LIPID PANEL
Cholesterol, Total: 136 mg/dL (ref 100–199)
HDL: 57 mg/dL (ref >39)
LDL CALC COMMENT:: 2.4 ratio (ref 0.0–5.0)
LDL Chol Calc (NIH): 66 mg/dL (ref 0–99)
Triglycerides: 61 mg/dL (ref 0–149)
VLDL Cholesterol Cal: 13 mg/dL (ref 5–40)

## 2023-08-29 LAB — CBC WITH DIFFERENTIAL/PLATELET
Basophils Absolute: 0 10*3/uL (ref 0.0–0.2)
Basos: 1 %
EOS (ABSOLUTE): 0.2 10*3/uL (ref 0.0–0.4)
Eos: 3 %
Hematocrit: 45 % (ref 37.5–51.0)
Hemoglobin: 15.2 g/dL (ref 13.0–17.7)
Immature Grans (Abs): 0 10*3/uL (ref 0.0–0.1)
Immature Granulocytes: 0 %
Lymphocytes Absolute: 1.9 10*3/uL (ref 0.7–3.1)
Lymphs: 27 %
MCH: 30.1 pg (ref 26.6–33.0)
MCHC: 33.8 g/dL (ref 31.5–35.7)
MCV: 89 fL (ref 79–97)
Monocytes Absolute: 0.5 10*3/uL (ref 0.1–0.9)
Monocytes: 7 %
Neutrophils Absolute: 4.3 10*3/uL (ref 1.4–7.0)
Neutrophils: 62 %
Platelets: 256 10*3/uL (ref 150–450)
RBC: 5.05 x10E6/uL (ref 4.14–5.80)
RDW: 12.6 % (ref 11.6–15.4)
WBC: 6.8 10*3/uL (ref 3.4–10.8)

## 2023-08-29 LAB — PSA, TOTAL AND FREE
PSA, Free Pct: 9.8 %
PSA, Free: 1.54 ng/mL
Prostate Specific Ag, Serum: 15.7 ng/mL — ABNORMAL HIGH (ref 0.0–4.0)

## 2023-08-29 LAB — VITAMIN D 25 HYDROXY (VIT D DEFICIENCY, FRACTURES): Vit D, 25-Hydroxy: 34.5 ng/mL (ref 30.0–100.0)

## 2023-09-03 ENCOUNTER — Other Ambulatory Visit

## 2023-09-10 ENCOUNTER — Ambulatory Visit (INDEPENDENT_AMBULATORY_CARE_PROVIDER_SITE_OTHER): Admitting: Family Medicine

## 2023-09-10 ENCOUNTER — Encounter: Payer: Self-pay | Admitting: Family Medicine

## 2023-09-10 VITALS — BP 123/67 | HR 65 | Temp 98.3°F | Ht 71.0 in | Wt 180.0 lb

## 2023-09-10 DIAGNOSIS — E782 Mixed hyperlipidemia: Secondary | ICD-10-CM

## 2023-09-10 DIAGNOSIS — Z0001 Encounter for general adult medical examination with abnormal findings: Secondary | ICD-10-CM | POA: Diagnosis not present

## 2023-09-10 DIAGNOSIS — R350 Frequency of micturition: Secondary | ICD-10-CM

## 2023-09-10 DIAGNOSIS — N401 Enlarged prostate with lower urinary tract symptoms: Secondary | ICD-10-CM | POA: Diagnosis not present

## 2023-09-10 DIAGNOSIS — N529 Male erectile dysfunction, unspecified: Secondary | ICD-10-CM | POA: Diagnosis not present

## 2023-09-10 DIAGNOSIS — I10 Essential (primary) hypertension: Secondary | ICD-10-CM | POA: Diagnosis not present

## 2023-09-10 DIAGNOSIS — K2100 Gastro-esophageal reflux disease with esophagitis, without bleeding: Secondary | ICD-10-CM | POA: Diagnosis not present

## 2023-09-10 DIAGNOSIS — Z0000 Encounter for general adult medical examination without abnormal findings: Secondary | ICD-10-CM

## 2023-09-10 DIAGNOSIS — I4891 Unspecified atrial fibrillation: Secondary | ICD-10-CM | POA: Diagnosis not present

## 2023-09-10 NOTE — Progress Notes (Signed)
 Subjective:  Patient ID: Raymond Vega, male    DOB: 13-Jun-1950  Age: 73 y.o. MRN: [id number]  CC: Annual Exam   HPI Raymond Vega presents for CPE.  Under workup by cardiology due to some tachycardia and palpitations.  He is currently wearing a ZIO monitor.  This was placed by cardiology.  Patient in for follow-up of elevated cholesterol. Doing well without complaints on current medication. Denies side effects of statin including myalgia and arthralgia and nausea. Also in today for liver function testing. Currently no chest pain, shortness of breath or other cardiovascular related symptoms noted.  Patient in for follow-up of GERD. Currently asymptomatic taking  PPI daily. There is no chest pain or heartburn. No hematemesis and no melena. No dysphagia or choking. Onset is remote. Progression is stable. Complicating factors, none.   Patient also taking tadalafil  daily for assistance with BPH as well as erectile dysfunction   Follow-up of hypertension. Patient has no history of headache chest pain or shortness of breath or recent cough. Patient also denies symptoms of TIA such as numbness weakness lateralizing. Patient checks  blood pressure at home and has not had any elevated readings recently. Patient denies side effects from his medication. States taking it regularly.      05/29/2023   11:51 AM 12/31/2022   10:32 AM 12/31/2022   10:21 AM  Depression screen PHQ 2/9  Decreased Interest 0 0 0  Down, Depressed, Hopeless 1 1 0  PHQ - 2 Score 1 1 0  Altered sleeping 1 0   Tired, decreased energy 0 0   Change in appetite 0 0   Feeling bad or failure about yourself  1 0   Trouble concentrating 0 0   Moving slowly or fidgety/restless 0 0   Suicidal thoughts 0 0   PHQ-9 Score 3 1   Difficult doing work/chores Somewhat difficult Not difficult at all     History Raymond Vega has a past medical history of Dysrhythmia, GERD (gastroesophageal reflux disease), Hypertension, and Prostate  cancer (HCC).   He has a past surgical history that includes Nasal fracture surgery; Fracture surgery; Circumcision; Band hemorrhoidectomy; Olecranon bursectomy (Right, 11/18/2015); Nerve and artery repair (Left, 12/24/2016); THUMB SURGERY (Left); LEFT FINGER SURGERY (Left); and Shoulder arthroscopy (Left, 01/15/2023).   His family history includes Heart attack (age of onset: 18) in his brother; Pneumonia in his father.He reports that he has never smoked. He has never used smokeless tobacco. He reports current alcohol use. He reports that he does not use drugs.    ROS Review of Systems  Constitutional:  Negative for activity change, fatigue and unexpected weight change.  HENT:  Negative for congestion, ear pain, hearing loss, postnasal drip and trouble swallowing.   Eyes:  Negative for pain and visual disturbance.  Respiratory:  Negative for cough, chest tightness and shortness of breath.   Cardiovascular:  Negative for chest pain, palpitations and leg swelling.  Gastrointestinal:  Negative for abdominal distention, abdominal pain, blood in stool, constipation, diarrhea, nausea and vomiting.  Endocrine: Negative for cold intolerance, heat intolerance and polydipsia.  Genitourinary:  Negative for difficulty urinating, dysuria, flank pain, frequency and urgency.  Musculoskeletal:  Negative for arthralgias and joint swelling.  Skin:  Negative for color change, rash and wound.  Neurological:  Negative for dizziness, syncope, speech difficulty, weakness, light-headedness, numbness and headaches.  Hematological:  Does not bruise/bleed easily.  Psychiatric/Behavioral:  Negative for confusion, decreased concentration, dysphoric mood and sleep disturbance. The patient is not nervous/anxious.  Objective:  BP 123/67   Pulse 65   Temp 98.3 F (36.8 C)   Ht 5\' 11"  (1.803 m)   Wt 180 lb (81.6 kg)   SpO2 98%   BMI 25.10 kg/m   BP Readings from Last 3 Encounters:  09/10/23 123/67  08/27/23  120/74  05/29/23 (!) 163/70    Wt Readings from Last 3 Encounters:  09/10/23 180 lb (81.6 kg)  08/27/23 182 lb (82.6 kg)  05/29/23 179 lb (81.2 kg)     Physical Exam Constitutional:      Appearance: He is well-developed.  HENT:     Head: Normocephalic and atraumatic.  Eyes:     Pupils: Pupils are equal, round, and reactive to light.  Neck:     Thyroid: No thyromegaly.     Trachea: No tracheal deviation.  Cardiovascular:     Rate and Rhythm: Normal rate and regular rhythm.     Heart sounds: Normal heart sounds. No murmur heard.    No friction rub. No gallop.  Pulmonary:     Breath sounds: Normal breath sounds. No wheezing or rales.  Abdominal:     General: Bowel sounds are normal. There is no distension.     Palpations: Abdomen is soft. There is no mass.     Tenderness: There is no abdominal tenderness.     Hernia: There is no hernia in the left inguinal area.  Genitourinary:    Penis: Normal.      Testes: Normal.  Musculoskeletal:        General: Normal range of motion.     Cervical back: Normal range of motion.  Lymphadenopathy:     Cervical: No cervical adenopathy.  Skin:    General: Skin is warm and dry.  Neurological:     Mental Status: He is alert and oriented to person, place, and time.      Assessment & Plan:  Well adult exam  Mixed hyperlipidemia  Gastroesophageal reflux disease with esophagitis without hemorrhage  Primary hypertension  Erectile dysfunction, unspecified erectile dysfunction type  Benign prostatic hyperplasia with urinary frequency  Atrial fibrillation, unspecified type (HCC)     Follow-up: Return in about 6 months (around 03/11/2024).  Roise Cleaver, M.D.

## 2023-09-16 DIAGNOSIS — C61 Malignant neoplasm of prostate: Secondary | ICD-10-CM | POA: Diagnosis not present

## 2023-09-16 DIAGNOSIS — Z9079 Acquired absence of other genital organ(s): Secondary | ICD-10-CM | POA: Diagnosis not present

## 2023-09-24 DIAGNOSIS — C61 Malignant neoplasm of prostate: Secondary | ICD-10-CM | POA: Diagnosis not present

## 2023-09-27 DIAGNOSIS — I480 Paroxysmal atrial fibrillation: Secondary | ICD-10-CM

## 2023-09-30 ENCOUNTER — Ambulatory Visit (HOSPITAL_COMMUNITY)
Admission: RE | Admit: 2023-09-30 | Discharge: 2023-09-30 | Disposition: A | Discharge status: Home / Self Care | Source: Ambulatory Visit | Attending: Cardiology | Admitting: Cardiology

## 2023-09-30 DIAGNOSIS — I38 Endocarditis, valve unspecified: Secondary | ICD-10-CM | POA: Diagnosis not present

## 2023-09-30 LAB — ECHOCARDIOGRAM COMPLETE
Area-P 1/2: 3.63 cm2
S' Lateral: 2.55 cm

## 2023-10-01 ENCOUNTER — Ambulatory Visit: Payer: Self-pay | Admitting: Cardiology

## 2023-10-14 ENCOUNTER — Telehealth: Payer: Self-pay | Admitting: *Deleted

## 2023-10-14 MED ORDER — DILTIAZEM HCL ER COATED BEADS 120 MG PO CP24
120.0000 mg | ORAL_CAPSULE | Freq: Every day | ORAL | 3 refills | Status: DC
Start: 2023-10-14 — End: 2024-02-14

## 2023-10-14 NOTE — Telephone Encounter (Signed)
 Called and informed patient , prescription was e-sent to [facility] -mail order.  Patient verbalized understanding.

## 2023-10-14 NOTE — Addendum Note (Signed)
 Addended by: Bebe Bourdon on: 10/14/2023 10:43 AM   Modules accepted: Orders

## 2023-10-14 NOTE — Telephone Encounter (Signed)
 Open in error

## 2023-10-22 DIAGNOSIS — C61 Malignant neoplasm of prostate: Secondary | ICD-10-CM | POA: Diagnosis not present

## 2023-10-22 DIAGNOSIS — R972 Elevated prostate specific antigen [PSA]: Secondary | ICD-10-CM | POA: Diagnosis not present

## 2023-10-22 DIAGNOSIS — N4231 Prostatic intraepithelial neoplasia: Secondary | ICD-10-CM | POA: Diagnosis not present

## 2023-10-24 NOTE — Progress Notes (Signed)
  Cardiology Office Note:   Date:  10/25/2023  ID:  Raymond Vega, DOB 11-13-1950, MRN [id number] PCP: Roise Cleaver, MD  [facility] Providers Cardiologist:  Eilleen Grates, MD {  History of Present Illness:   Raymond Vega is a 73 y.o. male who presents for evaluation of atrial fib. He was noted to be in this in [place].  He saw Dr. Emmette Harms in April 2025 and was started on Cardizem  but he wanted to hold off on DOAC.  Echo was unremarkable.  He did have a follow-up monitor.  This demonstrated 1% burden of atrial fibrillation flutter.  Average was 123 bpm.  The longest was 23 minutes and 43 seconds.  He wore the monitor for 13 days.  I had seen him in 2023.  In March of that year he had atrial fibrillation but he converted spontaneously prior to getting to the emergency room.  He had been sick at that time and was hypokalemic in the emergency room.  He reports that he had a nervous feeling in his chest at that time and some fluttering.  When he wore the monitor he had about 21 episodes of these only about 4 lasting longer than 6 minutes.  He does not feel it every time.  There is no trigger.  He otherwise feels well.  He is very vigorously active.  He denies any chest pressure, neck or arm discomfort.  He has no new shortness of breath, PND or orthopnea.  He is being worked up for prostate cancer and they have not yet decided on the treatment.  ROS:   As stated in the HPI and negative for all other systems.  Studies Reviewed:    EKG:     Sinus rhythm, rate 64, axis within normal limits, intervals within normal limits, nonspecific high lateral T wave changes    Risk Assessment/Calculations:    CHA2DS2-VASc Score = 2  Physical Exam:   VS:  BP (!) 140/72 (BP Location: Right Arm, Patient Position: Sitting, Cuff Size: Normal)   Pulse 64   Ht 5' 11 (1.803 m)   Wt 180 lb 9.6 oz (81.9 kg)   SpO2 98%   BMI 25.19 kg/m    Wt Readings from Last 3 Encounters:  10/25/23 180 lb 9.6 oz  (81.9 kg)  09/10/23 180 lb (81.6 kg)  08/27/23 182 lb (82.6 kg)     GEN: Well nourished, well developed in no acute distress NECK: No JVD; No carotid bruits CARDIAC: RRR, no murmurs, rubs, gallops RESPIRATORY:  Clear to auscultation without rales, wheezing or rhonchi  ABDOMEN: Soft, non-tender, non-distended EXTREMITIES:  No edema; No deformity   ASSESSMENT AND PLAN:   PAF:   The patient has PAF with fairly frequent but short episodes.  He does have a CHA2DS2-VASc of 2.  We had a long conversation about this.  He has no contraindication anticoagulation and he is more worried about having a stroke and bleeding from anticoagulation so I would agree that DOAC is indicated.  He will start with Xarelto.  We will check a CBC in about a month.   Hypertension: Blood pressures at upper limits and keep an eye on this.  No change in therapy.  Follow up with Atrial Fib Clinic in 3 months.   Signed, Eilleen Grates, MD

## 2023-10-25 ENCOUNTER — Ambulatory Visit: Attending: Cardiology | Admitting: Cardiology

## 2023-10-25 ENCOUNTER — Encounter: Payer: Self-pay | Admitting: Cardiology

## 2023-10-25 ENCOUNTER — Telehealth: Payer: Self-pay

## 2023-10-25 ENCOUNTER — Other Ambulatory Visit (HOSPITAL_COMMUNITY): Payer: Self-pay

## 2023-10-25 VITALS — BP 140/72 | HR 64 | Ht 71.0 in | Wt 180.6 lb

## 2023-10-25 DIAGNOSIS — I480 Paroxysmal atrial fibrillation: Secondary | ICD-10-CM

## 2023-10-25 DIAGNOSIS — I491 Atrial premature depolarization: Secondary | ICD-10-CM | POA: Diagnosis not present

## 2023-10-25 MED ORDER — RIVAROXABAN 20 MG PO TABS: 20.0000 mg | ORAL_TABLET | Freq: Every day | ORAL | 3 refills | Status: DC

## 2023-10-25 MED ORDER — RIVAROXABAN 20 MG PO TABS: 20.0000 mg | ORAL_TABLET | Freq: Every day | ORAL | 0 refills | Status: DC

## 2023-10-25 NOTE — Telephone Encounter (Signed)
 Medication name/dosage: Samples List: Xarelto 20 mg  Administration instructions: Take 1 tablet (20 mg) once daily  Reason for samples: Reason for samples: new start  Ordering provider: Hochrein, MD  *Once above information entered, route the phone encounter to [facility] SAMPLES and send Teams message to team member assigned to Samples for the day.

## 2023-10-25 NOTE — Patient Instructions (Signed)
 Medication Instructions:  Start Xarelto 20 mg once daily *If you need a refill on your cardiac medications before your next appointment, please call your pharmacy*  Lab Work: CBC in 1 month If you have labs (blood work) drawn today and your tests are completely normal, you will receive your results only by: MyChart Message (if you have MyChart) OR A paper copy in the mail If you have any lab test that is abnormal or we need to change your treatment, we will call you to review the results.  Testing/Procedures: NONE  Follow-Up: At [facility], you and your health needs are our priority.  As part of our continuing mission to provide you with exceptional heart care, our providers are all part of one team.  This team includes your primary Cardiologist (physician) and Advanced Practice Providers or APPs (Physician Assistants and Nurse Practitioners) who all work together to provide you with the care you need, when you need it.  Your next appointment:   Afib clinic in 3 months   We recommend signing up for the patient portal called MyChart.  Sign up information is provided on this After Visit Summary.  MyChart is used to connect with patients for Virtual Visits (Telemedicine).  Patients are able to view lab/test results, encounter notes, upcoming appointments, etc.  Non-urgent messages can be sent to your provider as well.   To learn more about what you can do with MyChart, go to [email or web address].   Other Instructions You have been referred to our Atrial Fibrillation clinic. Someone will reach out to you to make an appointment

## 2023-10-29 DIAGNOSIS — C61 Malignant neoplasm of prostate: Secondary | ICD-10-CM | POA: Diagnosis not present

## 2023-11-01 ENCOUNTER — Other Ambulatory Visit: Payer: Self-pay | Admitting: Radiation Oncology

## 2023-11-01 ENCOUNTER — Telehealth: Payer: Self-pay | Admitting: Radiation Oncology

## 2023-11-01 ENCOUNTER — Inpatient Hospital Stay
Admission: RE | Admit: 2023-11-01 | Discharge: 2023-11-01 | Disposition: A | Discharge status: Home / Self Care | Payer: Self-pay | Source: Ambulatory Visit | Attending: Radiation Oncology | Admitting: Radiation Oncology

## 2023-11-01 DIAGNOSIS — C61 Malignant neoplasm of prostate: Secondary | ICD-10-CM

## 2023-11-01 NOTE — Telephone Encounter (Signed)
 6/20 Patient called ready to be schedule, waiting on confirmation from Ashlyn B, if can schedule patient sooner.

## 2023-11-01 NOTE — Telephone Encounter (Signed)
 6/20 Received call/referral from [facility] A. from Dr. Annabella Keys office, missing TRUSP Bx report.  Called Dr. Aleck Amis office for report.  Already pushed over recent images to powershare -per Cornelius Dill A.  Called [place] spoke to [place] working on uploading PET/CT and US  images to patient's timeline in epic.  Waiting on images and trusp bx report.

## 2023-11-04 ENCOUNTER — Telehealth: Payer: Self-pay | Admitting: Radiation Oncology

## 2023-11-04 NOTE — Telephone Encounter (Signed)
 6/23 Patient decline referral at this time.  Called/left voicemail with Dr. Coleen office, so they are aware.

## 2023-11-11 DIAGNOSIS — C61 Malignant neoplasm of prostate: Secondary | ICD-10-CM | POA: Diagnosis not present

## 2023-11-13 ENCOUNTER — Ambulatory Visit: Admitting: Cardiology

## 2023-11-14 ENCOUNTER — Telehealth: Payer: Self-pay | Admitting: Family Medicine

## 2023-11-14 NOTE — Telephone Encounter (Signed)
 Patient called and advised tadalafil  5 mg tablet is on his profile for erectile dysfunction. He says he went to his oncologist and received hormone injections and he asked the oncologist about the injection for erectile dysfunction.  He says the oncologist can't give the injection and told him to see his PCP for that. Advised of an appointment to discuss with Dr. Zollie. He says he's going out of [place] country early Monday morning for 2 months and he would like a Rx so that he can take the injections with him to use as needed before sex. Advised I will send this to Dr. Zollie and someone will call with his recommendation. No available appointments today virtual or in person.   Preferred Pharmacy:  [facility] # [address] [place] - [address] Phone: [phone]  Fax: [phone]       Copied from CRM [id number]. Topic: Clinical - Medical Advice >> Nov 14, 2023  9:57 AM Turkey B wrote: pt wants to speak with Dr Zollie for med for Erectile Dysfunction

## 2023-11-14 NOTE — Telephone Encounter (Signed)
 Pt notified that we are unable to do that for him and to contact urology. Ls

## 2023-12-24 ENCOUNTER — Telehealth: Payer: Self-pay | Admitting: Cardiology

## 2023-12-24 NOTE — Telephone Encounter (Signed)
 Called and spoke w the patient. He is still out of the country and is still on the medicines but he states that by the time he returns home, he will have been out of valsartan -hydrochlorothiazide  for 1 week.  He wants to know from Dr. Lavona is he going to be okay.  He wants a call from Dr. Lavona if possible.    I adv patient I will send a message to MD letting him know.  Also adv to maintain low sodium diet and avoid stimulants and monitor BP as possible while away.  Continue diltiazem  and resume the valsartan -hydrochlorothiazide  daily once he returns home.  Still asked me to ask the doctor to call him.

## 2023-12-24 NOTE — Telephone Encounter (Signed)
 Pt c/o medication issue:  1. Name of Medication: valsartan -hydrochlorothiazide  (DIOVAN -HCT) 320-25 MG tablet   2. How are you currently taking this medication (dosage and times per day)?    3. Are you having a reaction (difficulty breathing--STAT)? no  4. What is your medication issue? Patient is out of the country for another week. Calling to see if its okay for him to be without this medication. Please advise

## 2024-01-22 ENCOUNTER — Ambulatory Visit: Admitting: Family Medicine

## 2024-01-22 ENCOUNTER — Ambulatory Visit (INDEPENDENT_AMBULATORY_CARE_PROVIDER_SITE_OTHER)

## 2024-01-22 VITALS — BP 133/80 | HR 66 | Temp 98.2°F | Ht 71.0 in | Wt 179.0 lb

## 2024-01-22 VITALS — BP 133/80 | HR 66 | Ht 71.0 in | Wt 179.0 lb

## 2024-01-22 DIAGNOSIS — Z202 Contact with and (suspected) exposure to infections with a predominantly sexual mode of transmission: Secondary | ICD-10-CM

## 2024-01-22 DIAGNOSIS — J329 Chronic sinusitis, unspecified: Secondary | ICD-10-CM

## 2024-01-22 DIAGNOSIS — C61 Malignant neoplasm of prostate: Secondary | ICD-10-CM

## 2024-01-22 DIAGNOSIS — I10 Essential (primary) hypertension: Secondary | ICD-10-CM | POA: Diagnosis not present

## 2024-01-22 DIAGNOSIS — J40 Bronchitis, not specified as acute or chronic: Secondary | ICD-10-CM | POA: Diagnosis not present

## 2024-01-22 DIAGNOSIS — E782 Mixed hyperlipidemia: Secondary | ICD-10-CM

## 2024-01-22 DIAGNOSIS — Z0000 Encounter for general adult medical examination without abnormal findings: Secondary | ICD-10-CM

## 2024-01-22 DIAGNOSIS — N529 Male erectile dysfunction, unspecified: Secondary | ICD-10-CM

## 2024-01-22 DIAGNOSIS — N522 Drug-induced erectile dysfunction: Secondary | ICD-10-CM

## 2024-01-22 DIAGNOSIS — R0981 Nasal congestion: Secondary | ICD-10-CM

## 2024-01-22 MED ORDER — PSEUDOEPHEDRINE-GUAIFENESIN ER 120-1200 MG PO TB12: 1.0000 | ORAL_TABLET | Freq: Two times a day (BID) | ORAL | 99 refills | Status: DC

## 2024-01-22 MED ORDER — TADALAFIL 5 MG PO TABS: 5.0000 mg | ORAL_TABLET | Freq: Every day | ORAL | 3 refills | Status: DC

## 2024-01-22 MED ORDER — TRI-MIX 150-5-50 MG-MG-MCG IC SOLR: 0.1000 mL | Freq: Every day | INTRACAVERNOUS | 5 refills | Status: DC | PRN

## 2024-01-22 MED ORDER — CEFPROZIL 500 MG PO TABS: 500.0000 mg | ORAL_TABLET | Freq: Two times a day (BID) | ORAL | 0 refills | Status: DC

## 2024-01-22 NOTE — Progress Notes (Signed)
 Subjective:   Raymond Vega is a 73 y.o. who presents for a Medicare Wellness preventive visit.  As a reminder, Annual Wellness Visits don't include a physical exam, and some assessments may be limited, especially if this visit is performed virtually. We may recommend an in-person follow-up visit with your provider if needed.  Visit Complete: Virtual I connected with  Jayson Gal on 01/22/24 by a audio enabled telemedicine application and verified that I am speaking with the correct person using two identifiers.  Patient Location: Home  Provider Location: Home Office  I discussed the limitations of evaluation and management by telemedicine. The patient expressed understanding and agreed to proceed.  Vital Signs: Because this visit was a virtual/telehealth visit, some criteria may be missing or patient reported. Any vitals not documented were not able to be obtained and vitals that have been documented are patient reported.  VideoDeclined- This patient declined Librarian, academic. Therefore the visit was completed with audio only.  Persons Participating in Visit: Patient.  AWV Questionnaire: No: Patient Medicare AWV questionnaire was not completed prior to this visit.  Cardiac Risk Factors include: advanced age (>59men, >22 women);dyslipidemia;hypertension;male gender;smoking/ tobacco exposure     Objective:    There were no vitals filed for this visit. There is no height or weight on file to calculate BMI.     01/22/2024    4:11 PM 01/03/2023   11:33 AM 07/16/2022    1:53 PM 08/10/2021    5:15 PM 11/18/2015   12:33 PM 11/10/2015   12:26 PM  Advanced Directives  Does Patient Have a Medical Advance Directive? No No Yes No Yes  Yes   Type of Surveyor, minerals;Living will  Healthcare Power of [place];Living will  Healthcare Power of [place];Living will   Does patient want to make changes to medical advance directive?      No - Patient declined    Copy of Healthcare Power of Attorney in Chart?   No - copy requested  No - copy requested       Data saved with a previous flowsheet row definition    Current Medications (verified) Outpatient Encounter Medications as of 01/22/2024  Medication Sig   acetaminophen  (TYLENOL ) 500 MG tablet Take 1,000 mg by mouth every 4 (four) hours as needed for moderate pain.   Ascorbic Acid (VITAMIN C) 1000 MG tablet Take 2,000-3,000 mg by mouth daily as needed (cold symptoms).   calcium  carbonate (OS-CAL) 1250 (500 Ca) MG chewable tablet Chew 1 tablet by mouth every 3 (three) days.   cefPROZIL  (CEFZIL ) 500 MG tablet Take 1 tablet (500 mg total) by mouth 2 (two) times daily. For infection. Take all of this medication.   diltiazem  (CARDIZEM  CD) 120 MG 24 hr capsule Take 1 capsule (120 mg total) by mouth daily.   fluticasone  (FLONASE ) 50 MCG/ACT nasal spray Place 2 sprays into both nostrils daily.   melatonin 5 MG TABS Take 5 mg by mouth at bedtime as needed (sleep).   omeprazole  (PRILOSEC) 20 MG capsule Take 1 capsule (20 mg total) by mouth daily.   Papav-Phentolamine-Alprostadil (TRI-MIX) 150-5-50 MG-MG-MCG SOLR 0.1 mLs by Intracavernosal route daily as needed.   Pseudoephedrine -Guaifenesin  938 [id number] MG TB12 Take 1 tablet by mouth 2 (two) times daily. For congestion   rivaroxaban  (XARELTO ) 20 MG TABS tablet Take 1 tablet (20 mg total) by mouth daily with supper.   rivaroxaban  (XARELTO ) 20 MG TABS tablet Take 1 tablet (20 mg total) by  mouth daily with supper.   rosuvastatin  (CRESTOR ) 10 MG tablet Take 1 tablet (10 mg total) by mouth daily. for cholesterol   tadalafil  (CIALIS ) 5 MG tablet Take 1 tablet (5 mg total) by mouth daily.   valsartan -hydrochlorothiazide  (DIOVAN -HCT) 320-25 MG tablet Take 1 tablet by mouth daily.   No facility-administered encounter medications on file as of 01/22/2024.    Allergies (verified) Patient has no known allergies.   History: Past Medical  History:  Diagnosis Date   Dysrhythmia    GERD (gastroesophageal reflux disease)    Hypertension    Prostate cancer (HCC)    Sleep apnea    Past Surgical History:  Procedure Laterality Date   BAND HEMORRHOIDECTOMY     CIRCUMCISION     COSMETIC SURGERY     FRACTURE SURGERY     LEFT FINGER SURGERY Left    INDEX, MIDDLE, LONG FINGERS   NASAL FRACTURE SURGERY     NERVE AND ARTERY REPAIR Left 12/24/2016   Procedure: EXPLORATION WITH REPAIRS AS NECESSARY LACERATION HAND;  Surgeon: Sissy Cough, MD;  Location: [facility];  Service: Orthopedics;  Laterality: Left;   OLECRANON BURSECTOMY Right 11/18/2015   Procedure: RIGHT ELBOW BURSECTOMY AND OLECRANON SPUR EXCISION;  Surgeon: Maude Herald, MD;  Location: [facility];  Service: Orthopedics;  Laterality: Right;   SHOULDER ARTHROSCOPY Left 01/15/2023   Procedure: LEFT SHOULDER ARTHROSCOPY, ACROMIOPLASTY AND DEBRIDEMENT;  Surgeon: Herald Maude, MD;  Location: [facility];  Service: Orthopedics;  Laterality: Left;   THUMB SURGERY Left    Family History  Problem Relation Age of Onset   Pneumonia Father    Heart attack Brother 79   Social History   Socioeconomic History   Marital status: Divorced    Spouse name: Not on file   Number of children: 3   Years of education: Not on file   Highest education level: Associate degree: academic program  Occupational History   Occupation: SELF EMPLOYED    Comment: [employer] RET[employer]  Tobacco Use   Smoking status: Never   Smokeless tobacco: Never  Vaping Use   Vaping status: Never Used  Substance and Sexual Activity   Alcohol use: Yes    Comment: rarely   Drug use: No   Sexual activity: Not on file  Other Topics Concern   Not on file  Social History Narrative   Not on file   Social Drivers of Health   Financial Resource Strain: Low Risk  (01/22/2024)   Overall Financial Resource Strain (CARDIA)    Difficulty of Paying Living Expenses: Not hard at all  Food Insecurity: No Food  Insecurity (01/22/2024)   Hunger Vital Sign    Worried About Running Out of Food in the Last Year: Never true    Ran Out of Food in the Last Year: Never true  Transportation Needs: No Transportation Needs (01/22/2024)   PRAPARE - Administrator, Civil Service (Medical): No    Lack of Transportation (Non-Medical): No  Physical Activity: Sufficiently Active (01/22/2024)   Exercise Vital Sign    Days of Exercise per Week: 3 days    Minutes of Exercise per Session: 60 min  Stress: No Stress Concern Present (01/22/2024)   [employer] of Occupational Health - Occupational Stress Questionnaire    Feeling of Stress: Only a little  Social Connections: Moderately Isolated (01/22/2024)   Social Connection and Isolation Panel    Frequency of Communication with Friends and Family: More than three times a week  Frequency of Social Gatherings with Friends and Family: Once a week    Attends Religious Services: More than 4 times per year    Active Member of [employer] or Organizations: No    Attends Engineer, structural: Not on file    Marital Status: Divorced    Tobacco Counseling Counseling given: Not Answered    Clinical Intake:  Pre-visit preparation completed: Yes  Pain : No/denies pain     BMI - recorded: 24.97 Nutritional Status: BMI of 19-24  Normal Nutritional Risks: None Diabetes: No  No results found for: HGBA1C   How often do you need to have someone help you when you read instructions, pamphlets, or other written materials from your doctor or pharmacy?: 1 - Never  Interpreter Needed?: No  Information entered by :: alia t/cma   Activities of Daily Living     01/22/2024    4:09 PM  In your present state of health, do you have any difficulty performing the following activities:  Hearing? 0  Vision? 0  Difficulty concentrating or making decisions? 0  Walking or climbing stairs? 0  Dressing or bathing? 0  Doing errands, shopping? 0  Preparing  Food and eating ? N  Using the Toilet? N  In the past six months, have you accidently leaked urine? N  Do you have problems with loss of bowel control? N  Managing your Medications? N  Managing your Finances? N  Housekeeping or managing your Housekeeping? N    Patient Care Team: Zollie Lowers, MD as PCP - General (Family Medicine) Lavona Agent, MD as PCP - Cardiology (Cardiology)  I have updated your Care Teams any recent Medical Services you may have received from other providers in the past year.     Assessment:   This is a routine wellness examination for Raymond Vega.  Hearing/Vision screen Hearing Screening - Comments:: Pt denies hearing dif Vision Screening - Comments:: Pt wear glasses/pt goes [facility] in [place]/last ov 2025   Goals Addressed   None    Depression Screen     01/22/2024    4:12 PM 01/22/2024    8:35 AM 05/29/2023   11:51 AM 12/31/2022   10:32 AM 12/31/2022   10:21 AM 07/16/2022    1:52 PM 06/06/2022    2:30 PM  PHQ 2/9 Scores  PHQ - 2 Score 0 0 1 1 0 0 1  PHQ- 9 Score 1 1 3 1   0 2    Fall Risk     01/22/2024    4:07 PM 01/22/2024    8:34 AM 12/31/2022   10:32 AM 12/31/2022   10:21 AM 07/16/2022    1:51 PM  Fall Risk   Falls in the past year? 0 0 0 0 0  Number falls in past yr: 0 0   0  Injury with Fall? 0 0   0  Risk for fall due to : No Fall Risks No Fall Risks   No Fall Risks  Follow up Falls evaluation completed;Education provided Falls evaluation completed   Falls prevention discussed    MEDICARE RISK AT HOME:  Medicare Risk at Home Any stairs in or around the home?: Yes If so, are there any without handrails?: Yes Home free of loose throw rugs in walkways, pet beds, electrical cords, etc?: Yes Adequate lighting in your home to reduce risk of falls?: Yes Life alert?: No Use of a cane, walker or w/c?: No Grab bars in the bathroom?: Yes Shower chair or bench in  shower?: Yes Elevated toilet seat or a handicapped toilet?: Yes  TIMED UP AND  GO:  Was the test performed?  no  Cognitive Function: 6CIT completed        01/22/2024    4:14 PM 07/16/2022    1:54 PM  6CIT Screen  What Year? 0 points 0 points  What month? 0 points 0 points  What time? 0 points 0 points  Count back from 20 0 points 0 points  Months in reverse 0 points 0 points  Repeat phrase 0 points 0 points  Total Score 0 points 0 points    Immunizations Immunization History  Administered Date(s) Administered   Tdap 12/24/2016    Screening Tests Health Maintenance  Topic Date Due   COVID-19 Vaccine (1) Never done   Hepatitis C Screening  Never done   Pneumococcal Vaccine: 50+ Years (1 of 1 - PCV) Never done   Influenza Vaccine  Never done   Colonoscopy  05/05/2024 (Originally 12/06/1997)   Zoster Vaccines- Shingrix (1 of 2) 05/28/2024 (Originally [birth year]/07/1971)   Medicare Annual Wellness (AWV)  01/21/2025   DTaP/Tdap/Td (2 - Td or Tdap) 12/25/2026   HPV VACCINES  Aged Out   Meningococcal B Vaccine  Aged Out    Health Maintenance Items Addressed: See Nurse Notes at the end of this note  Additional Screening:  Vision Screening: Recommended annual ophthalmology exams for early detection of glaucoma and other disorders of the eye. Is the patient up to date with their annual eye exam?  No  Who is the provider or what is the name of the office in which the patient attends annual eye exams? [facility]  Dental Screening: Recommended annual dental exams for proper oral hygiene  Community Resource Referral / Chronic Care Management: CRR required this visit?  No   CCM required this visit?  No   Plan:    I have personally reviewed and noted the following in the patient's chart:   Medical and social history Use of alcohol, tobacco or illicit drugs  Current medications and supplements including opioid prescriptions. Patient is not currently taking opioid prescriptions. Functional ability and status Nutritional status Physical  activity Advanced directives List of other physicians Hospitalizations, surgeries, and ER visits in previous 12 months Vitals Screenings to include cognitive, depression, and falls Referrals and appointments  In addition, I have reviewed and discussed with patient certain preventive protocols, quality metrics, and best practice recommendations. A written personalized care plan for preventive services as well as general preventive health recommendations were provided to patient.   Ozie Ned, CMA   01/22/2024   After Visit Summary: (MyChart) Due to this being a telephonic visit, the after visit summary with patients personalized plan was offered to patient via MyChart   Notes: PCP Follow Up Recommendations: Pt is aware and due the following: flu, pneumonia vaccine, pt declined, hep c--will talk to pcp about it.

## 2024-01-22 NOTE — Patient Instructions (Signed)
 Raymond Vega,  Thank you for taking the time for your Medicare Wellness Visit. I appreciate your continued commitment to your health goals. Please review the care plan we discussed, and feel free to reach out if I can assist you further.  Medicare recommends these wellness visits once per year to help you and your care team stay ahead of potential health issues. These visits are designed to focus on prevention, allowing your provider to concentrate on managing your acute and chronic conditions during your regular appointments.  Please note that Annual Wellness Visits do not include a physical exam. Some assessments may be limited, especially if the visit was conducted virtually. If needed, we may recommend a separate in-person follow-up with your provider.  Ongoing Care Seeing your primary care provider every 3 to 6 months helps us  monitor your health and provide consistent, personalized care.   Referrals If a referral was made during today's visit and you haven't received any updates within two weeks, please contact the referred provider directly to check on the status.  Recommended Screenings:  Health Maintenance  Topic Date Due   COVID-19 Vaccine (1) Never done   Hepatitis C Screening  Never done   Pneumococcal Vaccine for age over 75 (1 of 1 - PCV) Never done   Medicare Annual Wellness Visit  07/16/2023   Flu Shot  Never done   Colon Cancer Screening  05/05/2024*   Zoster (Shingles) Vaccine (1 of 2) 05/28/2024*   DTaP/Tdap/Td vaccine (2 - Td or Tdap) 12/25/2026   HPV Vaccine  Aged Out   Meningitis B Vaccine  Aged Out  *Topic was postponed. The date shown is not the original due date.       01/22/2024    4:11 PM  Advanced Directives  Does Patient Have a Medical Advance Directive? No   Advance Care Planning is important because it: Ensures you receive medical care that aligns with your values, goals, and preferences. Provides guidance to your family and loved ones, reducing  the emotional burden of decision-making during critical moments.  Vision: Annual vision screenings are recommended for early detection of glaucoma, cataracts, and diabetic retinopathy. These exams can also reveal signs of chronic conditions such as diabetes and high blood pressure.  Dental: Annual dental screenings help detect early signs of oral cancer, gum disease, and other conditions linked to overall health, including heart disease and diabetes.  Please see the attached documents for additional preventive care recommendations.

## 2024-01-22 NOTE — Progress Notes (Unsigned)
 Subjective:  Patient ID: Raymond Vega, male    DOB: 1950-07-05  Age: 73 y.o. MRN: [id number]  CC: cough/congestion (X 5 days/Fatigue/Body aches/Headache, occasional)   HPI  Discussed the use of AI scribe software for clinical note transcription with the patient, who gave verbal consent to proceed.  History of Present Illness Raymond Vega is a 73 year old male with prostate cancer who presents with sinus symptoms, cough, and congestion.  He has been experiencing sinus symptoms, cough, congestion, body aches, and headaches since the last day of his two-month trip to [place]. He endured a lengthy flight with delays, arriving home at 3 AM and waking up at 6 AM, which he describes as a rough trip. No known exposures during his time in [place]. COVID-19, flu, and RSV tests have been conducted, with results pending.  He is undergoing treatment for prostate cancer and received a hormone injection two months ago in preparation for radiation therapy starting in October. He reports side effects from the hormone injection, including fatigue and decreased interest in sex, which he notes became noticeable recently. The hormone therapy has significantly reduced his testosterone levels, affecting his sexual function.  He inquires about Trimix injections for erectile dysfunction. He also mentions using tadalafil  to manage urinary symptoms at night, as he experiences frequent urination.  He has a history of elevated PSA levels, which increased significantly over a six-week period, prompting a biopsy that confirmed aggressive prostate cancer. He is scheduled for five weeks of radiation therapy.  He is not currently taking Xarelto  or rivaroxaban , as he decided against it due to the risk of brain bleed and cost. He is taking valsartan  and diltiazem  for blood pressure management. He underwent an echocardiogram and has an irregular heart rate that is being monitored.  He requests blood tests,  including PSA, liver function, and STD screening, due to concerns about exposure to herpes from his girlfriend's mother. He experiences occasional dizziness and nausea, which he attributes to his time in [place].          01/22/2024    8:35 AM 05/29/2023   11:51 AM 12/31/2022   10:32 AM  Depression screen PHQ 2/9  Decreased Interest 0 0 0  Down, Depressed, Hopeless 0 1 1  PHQ - 2 Score 0 1 1  Altered sleeping 0 1 0  Tired, decreased energy 1 0 0  Change in appetite 0 0 0  Feeling bad or failure about yourself  0 1 0  Trouble concentrating 0 0 0  Moving slowly or fidgety/restless 0 0 0  Suicidal thoughts 0 0 0  PHQ-9 Score 1 3 1   Difficult doing work/chores Not difficult at all Somewhat difficult Not difficult at all    History [name] has a past medical history of Dysrhythmia, GERD (gastroesophageal reflux disease), Hypertension, and Prostate cancer (HCC).   He has a past surgical history that includes Nasal fracture surgery; Fracture surgery; Circumcision; Band hemorrhoidectomy; Olecranon bursectomy (Right, 11/18/2015); Nerve and artery repair (Left, 12/24/2016); THUMB SURGERY (Left); LEFT FINGER SURGERY (Left); and Shoulder arthroscopy (Left, 01/15/2023).   His family history includes Heart attack (age of onset: 72) in his brother; Pneumonia in his father.He reports that he has never smoked. He has never used smokeless tobacco. He reports current alcohol use. He reports that he does not use drugs.    ROS Review of Systems  Objective:  BP 133/80   Pulse 66   Temp 98.2 F (36.8 C)   Ht 5' 11 (  1.803 m)   Wt 179 lb (81.2 kg)   SpO2 98%   BMI 24.97 kg/m   BP Readings from Last 3 Encounters:  01/22/24 133/80  10/25/23 (!) 140/72  09/10/23 123/67    Wt Readings from Last 3 Encounters:  01/22/24 179 lb (81.2 kg)  10/25/23 180 lb 9.6 oz (81.9 kg)  09/10/23 180 lb (81.6 kg)     Physical Exam   Assessment & Plan:  Primary prostate cancer (HCC) -     PSA, total  and free  Nasal congestion -     COVID-19, Flu A+B and RSV -     CBC with Differential/Platelet  Sinobronchitis -     CBC with Differential/Platelet  Drug-induced erectile dysfunction -     CBC with Differential/Platelet  Primary hypertension -     CBC with Differential/Platelet -     CMP14+EGFR  Mixed hyperlipidemia -     CBC with Differential/Platelet -     CMP14+EGFR -     Lipid panel  STD exposure -     CBC with Differential/Platelet -     HSV(herpes simplex vrs) 1+2 ab-IgG -     HIV Antibody (routine testing w rflx)  Erectile dysfunction, unspecified erectile dysfunction type -     Tadalafil ; Take 1 tablet (5 mg total) by mouth daily.  Dispense: 90 tablet; Refill: 3  Other orders -     Tri-Mix; 0.1 mLs by Intracavernosal route daily as needed.  Dispense: 1 each; Refill: 5 -     Cefprozil ; Take 1 tablet (500 mg total) by mouth 2 (two) times daily. For infection. Take all of this medication.  Dispense: 20 tablet; Refill: 0 -     Pseudoephedrine -guaiFENesin  ER; Take 1 tablet by mouth 2 (two) times daily. For congestion  Dispense: 12 tablet; Refill: PRN    Assessment and Plan Assessment & Plan Acute upper respiratory infection with nasal congestion and cough   He presents with sinus congestion, cough, body aches, and headaches following a flight from [place]. COVID, flu, and RSV tests are pending. Prescribe traditional antibiotics and adjust treatment based on test results if positive for COVID, flu, or RSV.  Prostate cancer   He is undergoing hormone therapy, experiencing fatigue and decreased libido. Radiation therapy is scheduled for early October. PSA levels have increased significantly, indicating aggressive cancer. Continue with planned radiation therapy and monitor PSA levels.  Erectile dysfunction   Recent onset likely related to hormone therapy for prostate cancer. He is interested in using Trimix injections, previously discussed with his oncologist.  Prescribe Trimix, instruct on proper injection technique, and limit use to once daily. Print prescription for him to fill at his preferred pharmacy.  Benign prostatic hyperplasia with urinary frequency   He experiences urinary frequency, managed with tadalafil . Renew tadalafil  prescription.  Primary hypertension   Currently managed with valsartan  and diltiazem .  Mixed hyperlipidemia  Atrial fibrillation   Irregular heart rate noted, not requiring intervention. He is not taking anticoagulants due to risk of brain bleed and cost considerations. Cardiologist provided the option of taking Xarelto , which reduces the chance of heart attack or stroke to 2% and the chance of brain bleed to 0.3%. No current intervention needed.       Follow-up: Return in about 6 months (around 07/21/2024) for [facility] physical.  Butler Der, M.D.

## 2024-01-23 LAB — COVID-19, FLU A+B AND RSV
Influenza A, NAA: NOT DETECTED
Influenza B, NAA: NOT DETECTED
RSV, NAA: NOT DETECTED
SARS-CoV-2, NAA: NOT DETECTED

## 2024-01-24 ENCOUNTER — Other Ambulatory Visit

## 2024-01-24 ENCOUNTER — Encounter: Payer: Self-pay | Admitting: Family Medicine

## 2024-01-24 DIAGNOSIS — R0981 Nasal congestion: Secondary | ICD-10-CM | POA: Diagnosis not present

## 2024-01-24 DIAGNOSIS — J40 Bronchitis, not specified as acute or chronic: Secondary | ICD-10-CM | POA: Diagnosis not present

## 2024-01-24 DIAGNOSIS — Z202 Contact with and (suspected) exposure to infections with a predominantly sexual mode of transmission: Secondary | ICD-10-CM | POA: Diagnosis not present

## 2024-01-24 DIAGNOSIS — C61 Malignant neoplasm of prostate: Secondary | ICD-10-CM | POA: Diagnosis not present

## 2024-01-24 DIAGNOSIS — J329 Chronic sinusitis, unspecified: Secondary | ICD-10-CM | POA: Diagnosis not present

## 2024-01-24 DIAGNOSIS — N522 Drug-induced erectile dysfunction: Secondary | ICD-10-CM | POA: Diagnosis not present

## 2024-01-24 DIAGNOSIS — I10 Essential (primary) hypertension: Secondary | ICD-10-CM | POA: Diagnosis not present

## 2024-01-24 DIAGNOSIS — E782 Mixed hyperlipidemia: Secondary | ICD-10-CM | POA: Diagnosis not present

## 2024-01-24 LAB — LIPID PANEL

## 2024-01-25 LAB — CBC WITH DIFFERENTIAL/PLATELET
Basophils Absolute: 0 x10E3/uL (ref 0.0–0.2)
Basos: 1 %
EOS (ABSOLUTE): 0.2 x10E3/uL (ref 0.0–0.4)
Eos: 3 %
Hematocrit: 43 % (ref 37.5–51.0)
Hemoglobin: 14.5 g/dL (ref 13.0–17.7)
Immature Grans (Abs): 0 x10E3/uL (ref 0.0–0.1)
Immature Granulocytes: 0 %
Lymphocytes Absolute: 1.7 x10E3/uL (ref 0.7–3.1)
Lymphs: 33 %
MCH: 30.5 pg (ref 26.6–33.0)
MCHC: 33.7 g/dL (ref 31.5–35.7)
MCV: 90 fL (ref 79–97)
Monocytes Absolute: 0.5 x10E3/uL (ref 0.1–0.9)
Monocytes: 10 %
Neutrophils Absolute: 2.8 x10E3/uL (ref 1.4–7.0)
Neutrophils: 53 %
Platelets: 194 x10E3/uL (ref 150–450)
RBC: 4.76 x10E6/uL (ref 4.14–5.80)
RDW: 12.4 % (ref 11.6–15.4)
WBC: 5.2 x10E3/uL (ref 3.4–10.8)

## 2024-01-25 LAB — LIPID PANEL
Cholesterol, Total: 155 mg/dL (ref 100–199)
HDL: 48 mg/dL (ref >39)
LDL CALC COMMENT:: 3.2 ratio (ref 0.0–5.0)
LDL Chol Calc (NIH): 88 mg/dL (ref 0–99)
Triglycerides: 106 mg/dL (ref 0–149)
VLDL Cholesterol Cal: 19 mg/dL (ref 5–40)

## 2024-01-25 LAB — CMP14+EGFR
ALT: 153 IU/L — AB (ref 0–44)
AST: 93 IU/L — AB (ref 0–40)
Albumin: 4.1 g/dL (ref 3.8–4.8)
Alkaline Phosphatase: 75 IU/L (ref 44–121)
BUN/Creatinine Ratio: 22 (ref 10–24)
BUN: 21 mg/dL (ref 8–27)
Bilirubin Total: 0.6 mg/dL (ref 0.0–1.2)
CO2: 30 mmol/L — AB (ref 20–29)
Calcium: 9.3 mg/dL (ref 8.6–10.2)
Chloride: 98 mmol/L (ref 96–106)
Creatinine, Ser: 0.96 mg/dL (ref 0.76–1.27)
Globulin, Total: 2.7 g/dL (ref 1.5–4.5)
Glucose: 99 mg/dL (ref 70–99)
Potassium: 3.5 mmol/L (ref 3.5–5.2)
Sodium: 140 mmol/L (ref 134–144)
Total Protein: 6.8 g/dL (ref 6.0–8.5)
eGFR: 85 mL/min/1.73 (ref >59)

## 2024-01-25 LAB — HIV ANTIBODY (ROUTINE TESTING W REFLEX): HIV Screen 4th Generation wRfx: NONREACTIVE

## 2024-01-25 LAB — PSA, TOTAL AND FREE
PSA, Free Pct: 7.7
PSA, Free: 0.1 ng/mL
Prostate Specific Ag, Serum: 1.3 ng/mL (ref 0.0–4.0)

## 2024-01-26 ENCOUNTER — Ambulatory Visit: Payer: Self-pay | Admitting: Family Medicine

## 2024-01-26 DIAGNOSIS — R748 Abnormal levels of other serum enzymes: Secondary | ICD-10-CM

## 2024-01-27 ENCOUNTER — Ambulatory Visit (HOSPITAL_COMMUNITY): Admitting: Internal Medicine

## 2024-01-28 ENCOUNTER — Encounter (HOSPITAL_COMMUNITY): Payer: Self-pay | Admitting: Internal Medicine

## 2024-01-28 ENCOUNTER — Ambulatory Visit (HOSPITAL_COMMUNITY)
Admission: RE | Admit: 2024-01-28 | Discharge: 2024-01-28 | Disposition: A | Discharge status: Home / Self Care | Source: Ambulatory Visit | Attending: Internal Medicine | Admitting: Internal Medicine

## 2024-01-28 VITALS — BP 170/70 | HR 72 | Ht 71.0 in | Wt 182.4 lb

## 2024-01-28 DIAGNOSIS — D6869 Other thrombophilia: Secondary | ICD-10-CM

## 2024-01-28 DIAGNOSIS — I480 Paroxysmal atrial fibrillation: Secondary | ICD-10-CM | POA: Diagnosis not present

## 2024-01-28 NOTE — Progress Notes (Signed)
 Primary Care Physician: Zollie Lowers, MD Primary Cardiologist: Lynwood Schilling, MD Electrophysiologist: None     Referring Physician: Dr. Schilling Savant Rivero is a 73 y.o. male with a history of HTN, HLD, prostate cancer, and paroxysmal atrial fibrillation who presents for consultation in the [facility]. Seen by Dr. Schilling on 6/13 and begun on OAC. Patient is on Xarelto  for a CHADS2VASC score of 2.  On evaluation today, patient is currently in NSR. Monitor in April showed 1% Afib burden with longest duration under 24 minutes. He decided to not start Xarelto .   Today, he denies symptoms of palpitations, chest pain, shortness of breath, orthopnea, PND, lower extremity edema, dizziness, presyncope, syncope, snoring, daytime somnolence, bleeding, or neurologic sequela. The patient is tolerating medications without difficulties and is otherwise without complaint today.    he has a BMI of Body mass index is 25.44 kg/m.SABRA Filed Weights   01/28/24 1509  Weight: 82.7 kg    Current Outpatient Medications  Medication Sig Dispense Refill   acetaminophen  (TYLENOL ) 500 MG tablet Take 1,000 mg by mouth every 4 (four) hours as needed for moderate pain.     Ascorbic Acid (VITAMIN C) 1000 MG tablet Take 2,000-3,000 mg by mouth daily as needed (cold symptoms).     calcium  carbonate (OS-CAL) 1250 (500 Ca) MG chewable tablet Chew 1 tablet by mouth every 3 (three) days.     cefPROZIL  (CEFZIL ) 500 MG tablet Take 1 tablet (500 mg total) by mouth 2 (two) times daily. For infection. Take all of this medication. 20 tablet 0   diltiazem  (CARDIZEM  CD) 120 MG 24 hr capsule Take 1 capsule (120 mg total) by mouth daily. 90 capsule 3   fluticasone  (FLONASE ) 50 MCG/ACT nasal spray Place 2 sprays into both nostrils daily. 16 g 6   melatonin 5 MG TABS Take 5 mg by mouth at bedtime as needed (sleep).     omeprazole  (PRILOSEC) 20 MG capsule Take 1 capsule (20 mg total) by mouth  daily. 90 capsule 3   Papav-Phentolamine-Alprostadil (TRI-MIX) 150-5-50 MG-MG-MCG SOLR 0.1 mLs by Intracavernosal route daily as needed. 1 each 5   Pseudoephedrine -Guaifenesin  [phone] MG TB12 Take 1 tablet by mouth 2 (two) times daily. For congestion 12 tablet PRN   tadalafil  (CIALIS ) 5 MG tablet Take 1 tablet (5 mg total) by mouth daily. 90 tablet 3   valsartan -hydrochlorothiazide  (DIOVAN -HCT) 320-25 MG tablet Take 1 tablet by mouth daily. 90 tablet 3   rivaroxaban  (XARELTO ) 20 MG TABS tablet Take 1 tablet (20 mg total) by mouth daily with supper. (Patient not taking: Reported on 01/28/2024) 90 tablet 3   rivaroxaban  (XARELTO ) 20 MG TABS tablet Take 1 tablet (20 mg total) by mouth daily with supper. (Patient not taking: Reported on 01/28/2024) 28 tablet 0   No current facility-administered medications for this encounter.    Atrial Fibrillation Management history:  Previous antiarrhythmic drugs: none Previous cardioversions: none Previous ablations: none Anticoagulation history: Xarelto    ROS- All systems are reviewed and negative except as per the HPI above.  Physical Exam: BP (!) 170/70   Pulse 72   Ht 5' 11 (1.803 m)   Wt 82.7 kg   BMI 25.44 kg/m   GEN: Well nourished, well developed in no acute distress NECK: No JVD; No carotid bruits CARDIAC: Regular rate and rhythm, no murmurs, rubs, gallops RESPIRATORY:  Clear to auscultation without rales, wheezing or rhonchi  ABDOMEN: Soft, non-tender, non-distended EXTREMITIES:  No edema;  No deformity   EKG today demonstrates  Vent. rate 72 BPM PR interval 138 ms QRS duration 106 ms QT/QTcB 420/459 ms P-R-T axes -13 17 -1 Normal sinus rhythm Nonspecific ST abnormality Abnormal ECG When compared with ECG of 27-Aug-2023 14:22, No significant change was found  Echo 09/30/23 demonstrated  1. Left ventricular ejection fraction, by estimation, is 60 to 65%. The  left ventricle has normal function. The left ventricle has no  regional  wall motion abnormalities. There is mild left ventricular hypertrophy.  Left ventricular diastolic parameters  were normal. The average left ventricular global longitudinal strain is  -25.6 %. The global longitudinal strain is normal.   2. Right ventricular systolic function is normal. The right ventricular  size is normal.   3. The mitral valve is normal in structure. Trivial mitral valve  regurgitation.   4. The aortic valve is tricuspid. Aortic valve regurgitation is trivial.  Aortic valve sclerosis/calcification is present, without any evidence of  aortic stenosis.   5. The inferior vena cava is normal in size with greater than 50%  respiratory variability, suggesting right atrial pressure of 3 mmHg.   Cardiac monitor 08/2023: Conclusion: This study shows the following:                       1. 1% burden of atrial fibrillation/flutter                        2. Paroxysmal supraventricular tachycardia suspected to be atrial tachycardia                        3. Frequent Premature supraventricular tachycardia (8.2%, [id number])   ASSESSMENT & PLAN CHA2DS2-VASc Score = 2  The patient's score is based upon: CHF History: 0 HTN History: 1 Diabetes History: 0 Stroke History: 0 Vascular Disease History: 0 Age Score: 1 Gender Score: 0       ASSESSMENT AND PLAN: Paroxysmal Atrial Fibrillation (ICD10:  I48.0) The patient's CHA2DS2-VASc score is 2, indicating a 2.2% annual risk of stroke.    Patient is currently in NSR. Continue diltiazem  120 mg daily. Rhythm monitoring device recommended.    Secondary Hypercoagulable State (ICD10:  D68.69) The patient is at significant risk for stroke/thromboembolism based upon his CHA2DS2-VASc Score of 2.   Patient has decided to not start anticoagulation due to low burden. He will hold off on starting Xarelto  for now.  Hypertension Recheck 160/80. Patient advised to monitor BP at home to trend.     Follow up Afib clinic  prn.   Terra Pac, [facility]  Afib Clinic [address] [place], [place] [address] [phone]

## 2024-01-31 ENCOUNTER — Ambulatory Visit: Payer: Self-pay

## 2024-01-31 NOTE — Telephone Encounter (Signed)
 FYI Only or Action Required?: FYI only for provider.  Patient was last seen in primary care on 01/22/2024 by Zollie Lowers, MD.  Called Nurse Triage reporting Fatigue.  Symptoms began several weeks ago.  Interventions attempted: Prescription medications: started, but did not complete abx recently prescribed by PCP.  Symptoms are: gradually worsening.  Triage Disposition: See HCP Within 4 Hours (Or PCP Triage)  Patient/caregiver understands and will follow disposition?: Unsure        Copied from CRM #[id number]. Topic: Clinical - Red Word Triage >> Jan 31, 2024  4:20 PM Turkey B wrote: [facility] that prompted transfer to Nurse Triage: [place]06 says he doesn't feel well, feels very weak Reason for Disposition  [1] MODERATE weakness (e.g., interferes with work, school, normal activities) AND [2] cause unknown  (Exceptions: Weakness from acute minor illness or poor fluid intake; weakness is chronic and not worse.)  Answer Assessment - Initial Assessment Questions 1. DESCRIPTION: Describe how you are feeling.     Very weak Endorses starting with URI sx and saw PCP-- pt did not complete abx d/t concern for elevated LFT 2. SEVERITY: How bad is it?  Can you stand and walk?     It fluctuates 3. ONSET: When did these symptoms begin? (e.g., hours, days, weeks, months)     2 weeks ago 4. CAUSE: What do you think is causing the weakness or fatigue? (e.g., not drinking enough fluids, medical problem, trouble sleeping)     Thinks r/t elevated LFT 5. NEW MEDICINES:  Have you started on any new medicines recently? (e.g., opioid pain medicines, benzodiazepines, muscle relaxants, antidepressants, antihistamines, neuroleptics, beta blockers)     Pt reports taking abx recently from PCP 6. OTHER SYMPTOMS: Do you have any other symptoms? (e.g., chest pain, fever, cough, SOB, vomiting, diarrhea, bleeding, other areas of pain)     Concern for high  LFTs - hx of hepatitis and current sx are similar Some cough, URI sx 7. PREGNANCY: Is there any chance you are pregnant? When was your last menstrual period?     N/a    Pt has concern for generalized fatigue. Pt endorses having URI like sx and was prescribed abx from PCP. Pt reports not finishing abx d/t his hx of hepatitis.  Pt reports current sx are similar to when he had hepatitis and was notified by PCP of elevated LFTs. Triager strongly advised UC d/t fatigue. Patient verbalized understanding and to go to UC.  Protocols used: Weakness (Generalized) and Fatigue-A-AH

## 2024-02-03 NOTE — Telephone Encounter (Signed)
 Pt is gong to urgent care

## 2024-02-03 NOTE — Telephone Encounter (Signed)
Pt. Needs to be seen for this. Thanks, WS 

## 2024-02-06 ENCOUNTER — Telehealth: Payer: Self-pay | Admitting: Cardiology

## 2024-02-06 NOTE — Telephone Encounter (Unsigned)
 Copied from CRM #[id number]. Topic: Referral - Request for Referral >> Feb 06, 2024  9:01 AM Antwanette L wrote: Did the patient discuss referral with their provider in the last year? No   Appointment offered? Yes. The patient declined the appt. The patient saw Dr. Zollie on 01/22/24  Type of order/referral and detailed reason for visit: Hematology  Preference of office, provider, location: N/A  If referral order, have you been seen by this specialty before? Yes back in 2001 in [place]. Patient had the same issues before high liver enzymes.    Can we respond through MyChart? No. Contact the patient by phone at [phone] >> Feb 06, 2024  9:06 AM Antwanette L wrote: The patient has requested that Dr. Zollie submit a referral to hematology. He has not yet discussed this request directly with Dr. Zollie and declined an appointment when offered

## 2024-02-06 NOTE — Telephone Encounter (Signed)
 Lavona Agent, MD to [facility] Triage (Selected Message)   02/06/24  1:07 PM Please have him check his BP 3 x daily between now and the appt and bring these readings to the appt.    Patient call with advice from MD. Voiced understanding.

## 2024-02-06 NOTE — Telephone Encounter (Signed)
 Pt c/o BP issue: STAT if pt c/o blurred vision, one-sided weakness or slurred speech.  STAT if BP is GREATER than 180/120 TODAY.  STAT if BP is LESS than 90/60 and SYMPTOMATIC TODAY  1. What is your BP concern? Pt states BP has been elevated for the last week   2. Have you taken any BP medication today?yes  3. What are your last 5 BP readings?170/106-today 170/105 , 176/98  4. Are you having any other symptoms (ex. Dizziness, headache, blurred vision, passed out)? Dizziness and headache

## 2024-02-06 NOTE — Telephone Encounter (Signed)
 Yesterday 156/89 was the lowest his bp was done- in the evening.   3. What are your last 5 BP readings? 170/106-today 170/105 , 176/98   4. Are you having any other symptoms (ex. Dizziness, headache, blurred vision, passed out)? Dizziness and headache  Pt reports that he has been taking diltiazem  and valsartan -hydrochlorothiazide  at the same time every night.   Reports that he has not been taking in more salt, he has been watching his salt intake.  He says that the medications were working for a while- but within the last month his bp has increased. He was taking tadalafil  daily, but has stopped taking it.   He also reports that he is getting cancer treatment.   He is going to start taking the Valsartan -hydrochlorothiazide  in the morning and diltiazem  at night. He will keep a log of his BP- check it at least once a day 2 hours after taking morning medications. Instructed him to make sure he is sitting and has been at rest for at least 10 minutes, uncrossed legs, feet on the floor, and no talking when checking blood pressure.   Made an appt with APP for 02/13/24, asked him to bring bp log. Informed him that I will send this info to Dr Lavona and contact the pt with his recommendations. He verbalized understanding.

## 2024-02-07 ENCOUNTER — Ambulatory Visit (INDEPENDENT_AMBULATORY_CARE_PROVIDER_SITE_OTHER): Admitting: Family Medicine

## 2024-02-07 ENCOUNTER — Encounter: Payer: Self-pay | Admitting: Family Medicine

## 2024-02-07 VITALS — BP 153/77 | HR 67 | Temp 98.5°F | Ht 71.0 in | Wt 181.2 lb

## 2024-02-07 DIAGNOSIS — Z8619 Personal history of other infectious and parasitic diseases: Secondary | ICD-10-CM

## 2024-02-07 DIAGNOSIS — R748 Abnormal levels of other serum enzymes: Secondary | ICD-10-CM

## 2024-02-07 DIAGNOSIS — I10 Essential (primary) hypertension: Secondary | ICD-10-CM | POA: Diagnosis not present

## 2024-02-07 NOTE — Progress Notes (Signed)
 Acute Office Visit  Subjective:     Patient ID: Raymond Vega, male    DOB: 19-Sep-1950, 73 y.o.   MRN: [id number]  Chief Complaint  Patient presents with   Elevated Hepatic Enzymes    HPI  History of Present Illness   Raymond Vega is a 73 year old male who presents with concerns about elevated liver enzymes.  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, contracted in [place] from contaminated food or water - Liver dysfunction in 2001 potentially related to high protein intake for weightlifting - Recent routine laboratory testing approximately 11 days ago revealed elevated liver enzymes - Crestor  was being taken at the time of elevated liver enzymes and has since been discontinued - No alcohol consumption - Discontinued Tylenol   - No RUQ pain, jaundice, or weight loss - No nausea or vomiting - No fever - Concern regarding differentiating symptoms caused by liver dysfunction  Adverse effects of hormone therapy - Received hormone injection for prostate cancer approximately 2.5 to 3 months ago - Aware of a 5% risk of hepatic impact from the hormone injection - Experiencing dizziness, nausea, and hot flashes, attributed to the hormone injection       ROS As per HPI.      Objective:    BP (!) 153/77   Pulse 67   Temp 98.5 F (36.9 C) (Temporal)   Ht 5' 11 (1.803 m)   Wt 181 lb 3.2 oz (82.2 kg)   SpO2 98%   BMI 25.27 kg/m  Wt Readings from Last 3 Encounters:  02/07/24 181 lb 3.2 oz (82.2 kg)  01/28/24 182 lb 6.4 oz (82.7 kg)  01/22/24 179 lb (81.2 kg)      Physical Exam Vitals and nursing note reviewed.  Constitutional:      General: He is not in acute distress.    Appearance: Normal appearance. He is not ill-appearing, toxic-appearing or diaphoretic.  Eyes:     General: No scleral icterus. Cardiovascular:     Rate and Rhythm: Normal rate.     Heart sounds: Normal heart sounds.  Pulmonary:     Effort: Pulmonary  effort is normal.  Abdominal:     General: Bowel sounds are normal. There is no distension.     Palpations: Abdomen is soft. There is no hepatomegaly or mass.     Tenderness: There is no abdominal tenderness. There is no guarding or rebound. Negative signs include Murphy's sign.  Musculoskeletal:     Right lower leg: No edema.     Left lower leg: No edema.  Skin:    General: Skin is warm and dry.     Coloration: Skin is not jaundiced.  Neurological:     Mental Status: He is alert and oriented to person, place, and time. Mental status is at baseline.  Psychiatric:        Mood and Affect: Mood normal.        Behavior: Behavior normal.     No results found for any visits on 02/07/24.      Assessment & Plan:   Raymond Vega was seen today for elevated hepatic enzymes.  Diagnoses and all orders for this visit:  Elevated liver enzymes -     Hepatic Function Panel  History of hepatitis  Primary hypertension      Elevated liver enzymes Elevated liver enzymes noted. History of hepatitis A per patient decades ago. No Tylenol  or alcohol use. No symptoms. He is very concerned about  his liver function. Discussed will repeat LFTs today to see if they are trending down, though they most likely will not yet be back to normal.  - Order repeat liver enzyme tests today. - Review liver enzyme results on Monday. - Schedule follow-up appointment with PCP in two weeks to reassess liver enzymes. - Consider referral to specialist if liver enzymes remain elevated.     Hypertension BP is elevated today. Monitor BP at home and notify PCP for elevated for elevated home reading.   Return in about 2 weeks (around 02/21/2024) for with PCP for liver enzymes.  The patient indicates understanding of these issues and agrees with the plan.  [name], FNP

## 2024-02-08 LAB — HEPATIC FUNCTION PANEL
ALT: 53 IU/L — ABNORMAL HIGH (ref 0–44)
AST: 27 IU/L (ref 0–40)
Albumin: 4 g/dL (ref 3.8–4.8)
Alkaline Phosphatase: 78 IU/L (ref 47–123)
Bilirubin Total: 0.6 mg/dL (ref 0.0–1.2)
Bilirubin, Direct: 0.19 mg/dL (ref 0.00–0.40)
Total Protein: 6.6 g/dL (ref 6.0–8.5)

## 2024-02-10 ENCOUNTER — Other Ambulatory Visit: Payer: Self-pay | Admitting: Family Medicine

## 2024-02-10 ENCOUNTER — Ambulatory Visit: Payer: Self-pay | Admitting: Family Medicine

## 2024-02-10 NOTE — Progress Notes (Signed)
 Encounter to make referral to Adina Barge at [facility] for consideration of brachytherapy boost.

## 2024-02-13 ENCOUNTER — Other Ambulatory Visit: Payer: Self-pay | Admitting: Radiation Oncology

## 2024-02-13 ENCOUNTER — Inpatient Hospital Stay
Admission: RE | Admit: 2024-02-13 | Discharge: 2024-02-13 | Disposition: A | Discharge status: Home / Self Care | Payer: Self-pay | Source: Ambulatory Visit | Attending: Radiation Oncology | Admitting: Radiation Oncology

## 2024-02-13 ENCOUNTER — Ambulatory Visit: Admitting: Nurse Practitioner

## 2024-02-13 ENCOUNTER — Telehealth: Payer: Self-pay | Admitting: Radiation Oncology

## 2024-02-13 DIAGNOSIS — C61 Malignant neoplasm of prostate: Secondary | ICD-10-CM

## 2024-02-13 NOTE — Telephone Encounter (Signed)
 10/2 @ 11:27 am Left voicemail for patient to call our office to be sch for consult.

## 2024-02-13 NOTE — Telephone Encounter (Signed)
 10/2 Sent via stat fax recent for most recent pet images to be sent to [employer] from [facility]. Left voicemail with Can[facility] to attached to PACs in epic.

## 2024-02-14 ENCOUNTER — Encounter: Payer: Self-pay | Admitting: Cardiology

## 2024-02-14 ENCOUNTER — Telehealth: Payer: Self-pay | Admitting: Cardiology

## 2024-02-14 ENCOUNTER — Ambulatory Visit: Attending: Cardiology | Admitting: Cardiology

## 2024-02-14 VITALS — BP 130/80 | HR 70 | Ht 71.0 in | Wt 182.0 lb

## 2024-02-14 DIAGNOSIS — R079 Chest pain, unspecified: Secondary | ICD-10-CM

## 2024-02-14 DIAGNOSIS — Z01812 Encounter for preprocedural laboratory examination: Secondary | ICD-10-CM

## 2024-02-14 MED ORDER — METOPROLOL TARTRATE 100 MG PO TABS: 100.0000 mg | ORAL_TABLET | Freq: Once | ORAL | 0 refills | Status: DC

## 2024-02-14 MED ORDER — DILTIAZEM HCL ER COATED BEADS 240 MG PO CP24: 240.0000 mg | ORAL_CAPSULE | Freq: Every day | ORAL | 3 refills | Status: AC

## 2024-02-14 NOTE — Telephone Encounter (Signed)
  Pt c/o of Chest Pain: STAT if active CP, including tightness, pressure, jaw pain, radiating pain to shoulder/upper arm/back, CP unrelieved by Nitro. Symptoms reported of SOB, nausea, vomiting, sweating.  1. Are you having CP right now?   Yes - off and on for about half an hour now   2. Are you experiencing any other symptoms (ex. SOB, nausea, vomiting, sweating)?   A little bit of a headache for awhile now  3. Is your CP continuous or coming and going?   Comes and goes  4. Have you taken Nitroglycerin?   No   5. How long have you been experiencing CP?   Started half an hour ago  6. If NO CP at time of call then end call with telling Pt to call back or call 911 if Chest pain returns prior to return call from triage team.   Patient stated he has been having chest pain on his left side.

## 2024-02-14 NOTE — Telephone Encounter (Signed)
 Stat call for chest pain. Patient reports that he had an appt yesterday for high blood pressure that he missed. He reports his blood pressures have been high for about a week. Today, he states his BP was 161/113 at 9 AM. At 10 AM it was 180/134. His reports his BP has been high for several weeks. He also reports that he had some left sided chest pain this morning that is now resolving. He states he has also had mild headaches for a few weeks and that tylenol  does help with them. He denies blurry vision or weakness. He also reports, I just started hormonal treatment for prostate cancer and I have been really anxious about that. Talked with DOD Dr. Lavona, offered appt at 3:20 pm which patient accepts. Discussed ED precautions, patient verbalizes understanding to go to ED or call 911 if chest pain escalates or if SOB occurs, or if one-sided weakness develops.

## 2024-02-14 NOTE — Patient Instructions (Addendum)
 Medication Instructions:  Increase Cardizem  to 240 mg once daily *If you need a refill on your cardiac medications before your next appointment, please call your pharmacy*  Lab Work: BMET prior to CT If you have labs (blood work) drawn today and your tests are completely normal, you will receive your results only by: MyChart Message (if you have MyChart) OR A paper copy in the mail If you have any lab test that is abnormal or we need to change your treatment, we will call you to review the results.  Testing/Procedures: Coronary CTA  Follow-Up: At [facility], you and your health needs are our priority.  As part of our continuing mission to provide you with exceptional heart care, our providers are all part of one team.  This team includes your primary Cardiologist (physician) and Advanced Practice Providers or APPs (Physician Assistants and Nurse Practitioners) who all work together to provide you with the care you need, when you need it.  Your next appointment:   1 year  Provider:   Lavona, MD  We recommend signing up for the patient portal called MyChart.  Sign up information is provided on this After Visit Summary.  MyChart is used to connect with patients for Virtual Visits (Telemedicine).  Patients are able to view lab/test results, encounter notes, upcoming appointments, etc.  Non-urgent messages can be sent to your provider as well.   To learn more about what you can do with MyChart, go to [email or web address].   Other Instructions   Your cardiac CT will be scheduled at one of the below locations:   [facility] [address]  [place], [place] [address] [phone]  If scheduled at the Heart and Vascular Tower at [facility] street, please enter the parking lot using the [address] entrance and use the FREE valet service at the patient drop-off area. Enter the building and check-in with registration on the main  floor.  Please follow these instructions carefully (unless otherwise directed):  An IV will be required for this test and Nitroglycerin will be given.  Hold all erectile dysfunction medications at least 3 days (72 hrs) prior to test. (Ie viagra, cialis , sildenafil, tadalafil , etc)   On the Night Before the Test: Be sure to Drink plenty of water. Do not consume any caffeinated/decaffeinated beverages or chocolate 12 hours prior to your test. Do not take any antihistamines 12 hours prior to your test.  On the Day of the Test: Drink plenty of water until 1 hour prior to the test. Do not eat any food 1 hour prior to test. You may take your regular medications prior to the test.  Take metoprolol (Lopressor) 100 mg two hours prior to test. If you take Furosemide/Hydrochlorothiazide /Spironolactone/Chlorthalidone, please HOLD on the morning of the test. Patients who wear a continuous glucose monitor MUST remove the device prior to scanning. FEMALES- please wear underwire-free bra if available, avoid dresses & tight clothing  After the Test: Drink plenty of water. After receiving IV contrast, you may experience a mild flushed feeling. This is normal. On occasion, you may experience a mild rash up to 24 hours after the test. This is not dangerous. If this occurs, you can take Benadryl 25 mg, Zyrtec, Claritin, or Allegra and increase your fluid intake. (Patients taking Tikosyn should avoid Benadryl, and may take Zyrtec, Claritin, or Allegra) If you experience trouble breathing, this can be serious. If it is severe call 911 IMMEDIATELY. If it is mild, please call  our office.  We will call to schedule your test 2-4 weeks out understanding that some insurance companies will need an authorization prior to the service being performed.   For more information and frequently asked questions, please visit our website : [email or web address]  For non-scheduling related questions, please contact  the cardiac imaging nurse navigator should you have any questions/concerns: Cardiac Imaging Nurse Navigators Direct Office Dial: [phone]   For scheduling needs, including cancellations and rescheduling, please call [place], [phone].

## 2024-02-14 NOTE — Progress Notes (Signed)
 Cardiology Office Note:   Date:  02/14/2024  ID:  Raymond Vega, DOB 08/25/1950, MRN [id number] PCP: Zollie Lowers, MD  [facility] Providers Cardiologist:  Lynwood Schilling, MD {  History of Present Illness:   Raymond Vega is a 73 y.o. male who presents for evaluation of atrial fib. He was noted to be in this in [place].  He saw Dr. Sheena in April 2025 and was started on Cardizem  but he wanted to hold off on DOAC.  Echo was unremarkable.  He did have a follow-up monitor.  This demonstrated 1% burden of atrial fibrillation flutter.  Average was 123 bpm.  The longest was 23 minutes and 43 seconds.  He wore the monitor for 13 days.  I had seen him in 2023.  In March of that year he had atrial fibrillation but he converted spontaneously prior to getting to the emergency room.  He had been sick at that time and was hypokalemic in the emergency room.  He eventually had increased episodes and was started on DOAC.   He called today complaining of chest pain and was added to my schedule.  He reports that he has been having increased BP.  He shows me some blood pressure diaries.  His systolics have been in the 140s to 150s with diastolics in the 70s to 90s.  However, today it was 191/106.  It is improved as below on measurement and he did bring his cuff in to correlate with ours and they match.  He has been going through hormone therapy for prostate cancer and is going to have further management of this.  He reports some chest discomfort.  This has been left-sided under his left breast and left axilla.  It happens sporadically.  It is 4 out of 10 in intensity.  It lasts for a few minutes.  Has been going on for few weeks.  It is a dull aching discomfort.  It does not radiate to his jaw or to his arms.  He does not have associated nausea vomiting or diaphoresis.  Is not positional.  He cannot bring it on with activity and he does chores around his house.  He has had no new palpitations, presyncope or  syncope.  He had no weight gain or edema.  ROS: As stated in the HPI and negative for all other systems.  Studies Reviewed:    EKG:         Risk Assessment/Calculations:    CHA2DS2-VASc Score = 2   This indicates a 2.2% annual risk of stroke. The patient's score is based upon: CHF History: 0 HTN History: 1 Diabetes History: 0 Stroke History: 0 Vascular Disease History: 0 Age Score: 1 Gender Score: 0    Physical Exam:   VS:  BP 130/80   Pulse 70   Ht 5' 11 (1.803 m)   Wt 182 lb (82.6 kg)   SpO2 97%   BMI 25.38 kg/m    Wt Readings from Last 3 Encounters:  02/14/24 182 lb (82.6 kg)  02/07/24 181 lb 3.2 oz (82.2 kg)  01/28/24 182 lb 6.4 oz (82.7 kg)     GEN: Well nourished, well developed in no acute distress NECK: No JVD; No carotid bruits CARDIAC: RRR, no murmurs, rubs, gallops RESPIRATORY:  Clear to auscultation without rales, wheezing or rhonchi  ABDOMEN: Soft, non-tender, non-distended EXTREMITIES:  No edema; No deformity   ASSESSMENT AND PLAN:   PAF:   He has a history of PAF but with short episodes.  We have previously had a long conversation about this.  He is treated with  the patient has PAF with fairly frequent but short episodes.  He does have a CHA2DS2-VASc of 2.  We had a long conversation about this.  I thought that he was going to take Xarelto  after that conversation but he did not start it in part because of cost.  We talked about it again today.  He chooses not to start this.   Hypertension: Today and can increase his Cardizem  to 240 mg daily and he will keep his blood pressure diary.  Chest pain: He has chest discomfort.  He needs to be screened for coronary artery disease.  I will order a coronary CTA.  He does have cardiovascular risk factors.       Follow up in 1 year or sooner if needed based on the results of the above testing or blood pressure readings.  Signed, Lynwood Schilling, MD

## 2024-02-19 NOTE — Progress Notes (Signed)
 GU Location of Tumor / Histology: Prostate CA  If Prostate Cancer, Gleason Score is (3 + 3) and PSA is (15.7 )  Raymond Vega presented as referral from Dr. Leveda Dale Atlas ([facility])  Biopsies     Past/Anticipated interventions by urology, if any:  Dr. Bart A Frizzell    Past/Anticipated interventions by medical oncology, if any: NA  Weight changes, if any:  No  IPSS:  15 SHIM:  17  Bowel/Bladder complaints, if any:  No  Nausea/Vomiting, if any: No  Pain issues, if any:  0/10  SAFETY ISSUES: Prior radiation?  No Pacemaker/ICD? No Possible current pregnancy? Male Is the patient on methotrexate? No  Current Complaints / other details:  None   30 minutes spent total, including time for meaningful use questions, reviewing medication, as well as spent in face-to-face time in nurse evaluation with the patient.

## 2024-02-24 ENCOUNTER — Ambulatory Visit
Admission: RE | Admit: 2024-02-24 | Discharge: 2024-02-24 | Disposition: A | Source: Ambulatory Visit | Attending: Radiation Oncology | Admitting: Radiation Oncology

## 2024-02-24 ENCOUNTER — Ambulatory Visit
Admission: RE | Admit: 2024-02-24 | Discharge: 2024-02-24 | Disposition: A | Source: Ambulatory Visit | Attending: Radiation Oncology

## 2024-02-24 ENCOUNTER — Encounter: Payer: Self-pay | Admitting: Radiation Oncology

## 2024-02-24 VITALS — BP 145/87 | HR 71 | Temp 97.7°F | Resp 20 | Ht 71.0 in | Wt 181.2 lb

## 2024-02-24 DIAGNOSIS — Z79899 Other long term (current) drug therapy: Secondary | ICD-10-CM | POA: Diagnosis not present

## 2024-02-24 DIAGNOSIS — I10 Essential (primary) hypertension: Secondary | ICD-10-CM | POA: Diagnosis not present

## 2024-02-24 DIAGNOSIS — G4730 Sleep apnea, unspecified: Secondary | ICD-10-CM | POA: Diagnosis not present

## 2024-02-24 DIAGNOSIS — C61 Malignant neoplasm of prostate: Secondary | ICD-10-CM

## 2024-02-24 DIAGNOSIS — Z191 Hormone sensitive malignancy status: Secondary | ICD-10-CM | POA: Diagnosis not present

## 2024-02-24 DIAGNOSIS — K219 Gastro-esophageal reflux disease without esophagitis: Secondary | ICD-10-CM | POA: Insufficient documentation

## 2024-02-24 NOTE — Progress Notes (Signed)
 Introduced myself to the patient as the prostate nurse navigator.  No barriers to care identified at this time.  He is here to discuss his radiation treatment options.  Patient received Lupron  on 6/30 and is scheduled for CT Simulation on 10/21.  I gave him my business card and asked him to call me with questions or concerns.  Verbalized understanding.

## 2024-02-24 NOTE — Progress Notes (Signed)
 Radiation Oncology         [phone] ________________________________  Initial Outpatient Consultation  Name: Raymond Vega MRN: [id number]  Date: 02/24/2024  DOB: 28-Apr-1951  RR:Dujrxd, Butler, MD  Rhetta Leveda Redbird, MD   REFERRING PHYSICIAN: Rhetta Leveda Redbird, MD  DIAGNOSIS: 73 y.o. gentleman with Stage T1c adenocarcinoma of the prostate with Gleason score of 4+3, and PSA of 15.7.    ICD-10-CM   1. Primary prostate cancer [facility]  C61       HISTORY OF PRESENT ILLNESS: Raymond Vega, 73 year old man, seen in transfer from Dr. Frizzell for definitive management of localized prostate cancer.  He had incidental GG1 (Gleason 3+3) adenocarcinoma on TURP (01/25/2017) and chose observation. PSA has risen over time: 7.7 (11/2021) ? 11.1 (12/2022) ? 12.1 (05/22/2023) ? 13.4 (07/03/2023) ? 15.7 (08/28/2023). A PSMA PET (08/27/2023) showed two intraprostatic foci (peripheral zone, L SUVmax 14.8; R 11.9) with no nodal or distant uptake.  He underwent systematic TRUS biopsy on 10/22/2023 (TRUS prostate volume 36 mL; anal stenosis noted). Pathology demonstrated bilateral adenocarcinoma in 5 out of 12 regions:  Right: mid 3+4=7 (80%), base 3+4=7 (50%), lateral apex 3+4=7 (30%); apex PIN. Left: mid 4+3=7 (20%), lateral apex 4+3=7 (50%); base PIN. Remaining cores benign. Digital rectal exam cT1c in outside notes.  He received a 6 month Lupron  depot injection at the end of June.  The patient reviewed the results and he has kindly been referred today for discussion of potential radiation treatment options.   PREVIOUS RADIATION THERAPY: No  PAST MEDICAL HISTORY:  Past Medical History:  Diagnosis Date   Dysrhythmia    GERD (gastroesophageal reflux disease)    Hypertension    Prostate cancer (HCC)    Sleep apnea       PAST SURGICAL HISTORY: Past Surgical History:  Procedure Laterality Date   BAND HEMORRHOIDECTOMY     CIRCUMCISION     COSMETIC SURGERY     FRACTURE SURGERY     LEFT  FINGER SURGERY Left    INDEX, MIDDLE, LONG FINGERS   NASAL FRACTURE SURGERY     NERVE AND ARTERY REPAIR Left 12/24/2016   Procedure: EXPLORATION WITH REPAIRS AS NECESSARY LACERATION HAND;  Surgeon: Sissy Cough, MD;  Location: [facility] OR;  Service: Orthopedics;  Laterality: Left;   OLECRANON BURSECTOMY Right 11/18/2015   Procedure: RIGHT ELBOW BURSECTOMY AND OLECRANON SPUR EXCISION;  Surgeon: Maude Herald, MD;  Location: [facility];  Service: Orthopedics;  Laterality: Right;   SHOULDER ARTHROSCOPY Left 01/15/2023   Procedure: LEFT SHOULDER ARTHROSCOPY, ACROMIOPLASTY AND DEBRIDEMENT;  Surgeon: Herald Maude, MD;  Location: [facility];  Service: Orthopedics;  Laterality: Left;   THUMB SURGERY Left     FAMILY HISTORY:  Family History  Problem Relation Age of Onset   Pneumonia Father    Heart attack Brother 23    SOCIAL HISTORY:  Social History   Socioeconomic History   Marital status: Divorced    Spouse name: Not on file   Number of children: 3   Years of education: Not on file   Highest education level: Associate degree: academic program  Occupational History   Occupation: SELF EMPLOYED    Comment: [employer]  Tobacco Use   Smoking status: Never   Smokeless tobacco: Never   Tobacco comments:    Never smoked 01/28/24  Vaping Use   Vaping status: Never Used  Substance and Sexual Activity   Alcohol use: Yes    Comment: rarely   Drug use: No  Sexual activity: Not on file  Other Topics Concern   Not on file  Social History Narrative   Not on file   Social Drivers of Health   Financial Resource Strain: Low Risk  (01/22/2024)   Overall Financial Resource Strain (CARDIA)    Difficulty of Paying Living Expenses: Not hard at all  Food Insecurity: No Food Insecurity (02/24/2024)   Hunger Vital Sign    Worried About Running Out of Food in the Last Year: Never true    Ran Out of Food in the Last Year: Never true  Transportation Needs: No Transportation Needs  (02/24/2024)   PRAPARE - Administrator, Civil Service (Medical): No    Lack of Transportation (Non-Medical): No  Physical Activity: Sufficiently Active (01/22/2024)   Exercise Vital Sign    Days of Exercise per Week: 3 days    Minutes of Exercise per Session: 60 min  Stress: No Stress Concern Present (01/22/2024)   [employer] of Occupational Health - Occupational Stress Questionnaire    Feeling of Stress: Only a little  Social Connections: Moderately Isolated (01/22/2024)   Social Connection and Isolation Panel    Frequency of Communication with Friends and Family: More than three times a week    Frequency of Social Gatherings with Friends and Family: Once a week    Attends Religious Services: More than 4 times per year    Active Member of [employer] or Organizations: No    Attends Banker Meetings: Not on file    Marital Status: Divorced  Intimate Partner Violence: Not At Risk (02/24/2024)   Humiliation, Afraid, Rape, and Kick questionnaire    Fear of Current or Ex-Partner: No    Emotionally Abused: No    Physically Abused: No    Sexually Abused: No    ALLERGIES: Patient has no known allergies.  MEDICATIONS:  Current Outpatient Medications  Medication Sig Dispense Refill   acetaminophen  (TYLENOL ) 500 MG tablet Take 1,000 mg by mouth every 4 (four) hours as needed for moderate pain.     Ascorbic Acid (VITAMIN C) 1000 MG tablet Take 2,000-3,000 mg by mouth daily as needed (cold symptoms).     calcium  carbonate (OS-CAL) 1250 (500 Ca) MG chewable tablet Chew 1 tablet by mouth every 3 (three) days.     diltiazem  (CARDIZEM  CD) 240 MG 24 hr capsule Take 1 capsule (240 mg total) by mouth daily. 90 capsule 3   fluticasone  (FLONASE ) 50 MCG/ACT nasal spray Place 2 sprays into both nostrils daily. 16 g 6   melatonin 5 MG TABS Take 5 mg by mouth at bedtime as needed (sleep).     metoprolol tartrate (LOPRESSOR) 100 MG tablet Take 1 tablet (100 mg total) by mouth once  for 1 dose. Take 90-120 minutes prior to scan. Hold for SBP less than 110. 1 tablet 0   omeprazole  (PRILOSEC) 20 MG capsule TAKE 1 CAPSULE DAILY 90 capsule 0   Papav-Phentolamine-Alprostadil (TRI-MIX) 150-5-50 MG-MG-MCG SOLR 0.1 mLs by Intracavernosal route daily as needed. (Patient not taking: Reported on 02/14/2024) 1 each 5   tadalafil  (CIALIS ) 5 MG tablet Take 1 tablet (5 mg total) by mouth daily. 90 tablet 3   valsartan -hydrochlorothiazide  (DIOVAN -HCT) 320-25 MG tablet Take 1 tablet by mouth daily. 90 tablet 3   No current facility-administered medications for this encounter.      REVIEW OF SYSTEMS:  On review of systems, the patient reports that he is doing well overall. He denies any chest pain, shortness of  breath, cough, fevers, chills, night sweats, unintended weight changes. He denies any bowel disturbances, and denies abdominal pain, nausea or vomiting. He denies any new musculoskeletal or joint aches or pains. His IPSS was Total Score: 15, indicating moderate urinary symptoms (Reference 0-7 mild, 8-19 moderate, 20-35 severe).  His SHIM: 17, indicating he mild erectile dysfunction (Reference - 22-25 None, 17-21 Mild, 8-16 Moderate, 1-7 Severe). A complete review of systems is obtained and is otherwise negative.     PHYSICAL EXAM:  Wt Readings from Last 3 Encounters:  02/24/24 181 lb 3.2 oz (82.2 kg)  02/14/24 182 lb (82.6 kg)  02/07/24 181 lb 3.2 oz (82.2 kg)   Temp Readings from Last 3 Encounters:  02/24/24 97.7 F (36.5 C)  02/07/24 98.5 F (36.9 C) (Temporal)  01/22/24 98.2 F (36.8 C)   BP Readings from Last 3 Encounters:  02/24/24 (!) 145/87  02/14/24 130/80  02/07/24 (!) 153/77   Pulse Readings from Last 3 Encounters:  02/24/24 71  02/14/24 70  02/07/24 67    /10  In general this is a well appearing male in no acute distress. He's alert and oriented x4 and appropriate throughout the examination. Cardiopulmonary assessment is negative for acute distress, and  he exhibits normal effort.     KPS = 100  100 - Normal; no complaints; no evidence of disease. 90   - Able to carry on normal activity; minor signs or symptoms of disease. 80   - Normal activity with effort; some signs or symptoms of disease. 41   - Cares for self; unable to carry on normal activity or to do active work. 60   - Requires occasional assistance, but is able to care for most of his personal needs. 50   - Requires considerable assistance and frequent medical care. 40   - Disabled; requires special care and assistance. 30   - Severely disabled; hospital admission is indicated although death not imminent. 20   - Very sick; hospital admission necessary; active supportive treatment necessary. 10   - Moribund; fatal processes progressing rapidly. 0     - Dead  Karnofsky DA, Abelmann WH, Craver LS and Burchenal [facility] [phone]) The use of the nitrogen mustards in the palliative treatment of carcinoma: with particular reference to bronchogenic carcinoma Cancer 1 634-56  LABORATORY DATA:  Lab Results  Component Value Date   WBC 5.2 01/24/2024   HGB 14.5 01/24/2024   HCT 43.0 01/24/2024   MCV 90 01/24/2024   PLT 194 01/24/2024   Lab Results  Component Value Date   NA 140 01/24/2024   K 3.5 01/24/2024   CL 98 01/24/2024   CO2 30 (H) 01/24/2024   Lab Results  Component Value Date   ALT 53 (H) 02/07/2024   AST 27 02/07/2024   ALKPHOS 78 02/07/2024   BILITOT 0.6 02/07/2024     RADIOGRAPHY: No results found.    IMPRESSION/PLAN: 1. 73 y.o. gentleman with Stage T1c adenocarcinoma of the prostate with Gleason Score of 4+3, and PSA of 15.7 . We discussed the patient's workup and outlined the nature of prostate cancer in this setting. The patient's T stage, Gleason's score, and PSA put him into the Unfavorable Intermediate risk group. Accordingly, he is eligible for a variety of potential treatment options including 5.5 weeks of external radiation or prostatectomy. We discussed the  available radiation techniques, and focused on the details and logistics of delivery. The patient may not be an ideal candidate for brachytherapy due to anatomic  changes with previous TURP in 2018.  We discussed and outlined the risks, benefits, short and long-term effects associated with radiotherapy and compared and contrasted these with prostatectomy. We discussed the role of SpaceOAR gel in reducing the rectal toxicity associated with radiotherapy. We also detailed the role of ADT in the treatment of UIR prostate cancer and outlined the associated side effects that could be expected with this therapy.  He appears to have a good understanding of his disease and our treatment recommendations which are of curative intent.  He was encouraged to ask questions that were answered to his stated satisfaction.  At the conclusion of our conversation, the patient is interested in moving forward with IMRT to the prostate in conjunction with ST-ADT.  We personally spent 60 minutes in this encounter including chart review, reviewing radiological studies, meeting face-to-face with the patient, entering orders and completing documentation.      [name], MD  [facility]  Radiation Oncology Direct Dial: [phone]  Fax: [phone] [email or web address]  Skype  LinkedIn

## 2024-02-26 ENCOUNTER — Other Ambulatory Visit

## 2024-02-26 ENCOUNTER — Other Ambulatory Visit: Payer: Self-pay | Admitting: *Deleted

## 2024-02-26 ENCOUNTER — Other Ambulatory Visit: Payer: Self-pay | Admitting: Family Medicine

## 2024-02-26 DIAGNOSIS — N529 Male erectile dysfunction, unspecified: Secondary | ICD-10-CM | POA: Diagnosis not present

## 2024-02-26 DIAGNOSIS — E782 Mixed hyperlipidemia: Secondary | ICD-10-CM | POA: Diagnosis not present

## 2024-02-26 DIAGNOSIS — N401 Enlarged prostate with lower urinary tract symptoms: Secondary | ICD-10-CM

## 2024-02-26 DIAGNOSIS — I10 Essential (primary) hypertension: Secondary | ICD-10-CM | POA: Diagnosis not present

## 2024-02-26 DIAGNOSIS — R350 Frequency of micturition: Secondary | ICD-10-CM | POA: Diagnosis not present

## 2024-02-26 DIAGNOSIS — C61 Malignant neoplasm of prostate: Secondary | ICD-10-CM

## 2024-02-27 DIAGNOSIS — Z6825 Body mass index (BMI) 25.0-25.9, adult: Secondary | ICD-10-CM | POA: Diagnosis not present

## 2024-02-27 DIAGNOSIS — E663 Overweight: Secondary | ICD-10-CM | POA: Diagnosis not present

## 2024-02-27 DIAGNOSIS — Z008 Encounter for other general examination: Secondary | ICD-10-CM | POA: Diagnosis not present

## 2024-02-27 LAB — CBC WITH DIFFERENTIAL/PLATELET
Basophils Absolute: 0 x10E3/uL (ref 0.0–0.2)
Basos: 0 %
EOS (ABSOLUTE): 0.2 x10E3/uL (ref 0.0–0.4)
Eos: 3 %
Hematocrit: 38.6 % (ref 37.5–51.0)
Hemoglobin: 13 g/dL (ref 13.0–17.7)
Immature Grans (Abs): 0 x10E3/uL (ref 0.0–0.1)
Immature Granulocytes: 0 %
Lymphocytes Absolute: 1.4 x10E3/uL (ref 0.7–3.1)
Lymphs: 30 %
MCH: 30.2 pg (ref 26.6–33.0)
MCHC: 33.7 g/dL (ref 31.5–35.7)
MCV: 90 fL (ref 79–97)
Monocytes Absolute: 0.4 x10E3/uL (ref 0.1–0.9)
Monocytes: 8 %
Neutrophils Absolute: 2.7 x10E3/uL (ref 1.4–7.0)
Neutrophils: 59 %
Platelets: 232 x10E3/uL (ref 150–450)
RBC: 4.31 x10E6/uL (ref 4.14–5.80)
RDW: 12.7 % (ref 11.6–15.4)
WBC: 4.7 x10E3/uL (ref 3.4–10.8)

## 2024-02-27 LAB — CMP14+EGFR
ALT: 26 IU/L (ref 0–44)
AST: 22 IU/L (ref 0–40)
Albumin: 4.2 g/dL (ref 3.8–4.8)
Alkaline Phosphatase: 72 IU/L (ref 47–123)
BUN/Creatinine Ratio: 19 (ref 10–24)
BUN: 19 mg/dL (ref 8–27)
Bilirubin Total: 0.6 mg/dL (ref 0.0–1.2)
CO2: 28 mmol/L (ref 20–29)
Calcium: 9.3 mg/dL (ref 8.6–10.2)
Chloride: 99 mmol/L (ref 96–106)
Creatinine, Ser: 0.98 mg/dL (ref 0.76–1.27)
Globulin, Total: 2.5 g/dL (ref 1.5–4.5)
Glucose: 94 mg/dL (ref 70–99)
Potassium: 3.5 mmol/L (ref 3.5–5.2)
Sodium: 140 mmol/L (ref 134–144)
Total Protein: 6.7 g/dL (ref 6.0–8.5)
eGFR: 82 mL/min/1.73 (ref >59)

## 2024-02-27 LAB — LIPID PANEL
Chol/HDL Ratio: 4.3 ratio (ref 0.0–5.0)
Cholesterol, Total: 221 mg/dL — ABNORMAL HIGH (ref 100–199)
HDL: 52 mg/dL (ref >39)
LDL Chol Calc (NIH): 155 mg/dL — ABNORMAL HIGH (ref 0–99)
Triglycerides: 79 mg/dL (ref 0–149)
VLDL Cholesterol Cal: 14 mg/dL (ref 5–40)

## 2024-02-27 LAB — TESTOSTERONE,FREE AND TOTAL
Testosterone, Free: 0.2 pg/mL — AB (ref 6.6–18.1)
Testosterone: <3 ng/dL — AB (ref 264–916)

## 2024-02-27 LAB — PSA, TOTAL AND FREE
PSA, Free Pct: 8.5 %
PSA, Free: 0.11 ng/mL
Prostate Specific Ag, Serum: 1.3 ng/mL (ref 0.0–4.0)

## 2024-02-28 ENCOUNTER — Telehealth (HOSPITAL_COMMUNITY): Payer: Self-pay | Admitting: Emergency Medicine

## 2024-02-28 NOTE — Telephone Encounter (Signed)
 Reaching out to patient to offer assistance regarding upcoming cardiac imaging study; pt verbalizes understanding of appt date/time, parking situation and where to check in, pre-test NPO status and medications ordered, and verified current allergies; name and call back number provided for further questions should they arise Rockwell Alexandria RN Navigator Cardiac Imaging [facility] [phone] office [phone] cell

## 2024-03-02 ENCOUNTER — Ambulatory Visit (HOSPITAL_BASED_OUTPATIENT_CLINIC_OR_DEPARTMENT_OTHER)
Admission: RE | Admit: 2024-03-02 | Discharge: 2024-03-02 | Disposition: A | Discharge status: Home / Self Care | Source: Ambulatory Visit | Attending: Cardiovascular Disease | Admitting: Cardiovascular Disease

## 2024-03-02 ENCOUNTER — Other Ambulatory Visit: Payer: Self-pay | Admitting: Cardiovascular Disease

## 2024-03-02 ENCOUNTER — Ambulatory Visit (HOSPITAL_COMMUNITY)
Admission: RE | Admit: 2024-03-02 | Discharge: 2024-03-02 | Disposition: A | Discharge status: Home / Self Care | Source: Ambulatory Visit | Attending: Cardiology | Admitting: Cardiology

## 2024-03-02 DIAGNOSIS — R931 Abnormal findings on diagnostic imaging of heart and coronary circulation: Secondary | ICD-10-CM

## 2024-03-02 DIAGNOSIS — I2584 Coronary atherosclerosis due to calcified coronary lesion: Secondary | ICD-10-CM | POA: Insufficient documentation

## 2024-03-02 DIAGNOSIS — I2510 Atherosclerotic heart disease of native coronary artery without angina pectoris: Secondary | ICD-10-CM | POA: Diagnosis not present

## 2024-03-02 DIAGNOSIS — R079 Chest pain, unspecified: Secondary | ICD-10-CM | POA: Diagnosis not present

## 2024-03-02 MED ORDER — IOHEXOL 350 MG/ML SOLN
100.0000 mL | Freq: Once | INTRAVENOUS | Status: AC | PRN
  Administered 2024-03-02: 100 mL via INTRAVENOUS

## 2024-03-02 MED ORDER — NITROGLYCERIN 0.4 MG SL SUBL
0.8000 mg | SUBLINGUAL_TABLET | Freq: Once | SUBLINGUAL | Status: AC
  Administered 2024-03-02: 0.8 mg via SUBLINGUAL

## 2024-03-02 NOTE — Progress Notes (Signed)
 CT FFR ordered.  Signed, Darryle DASEN. Barbaraann, MD, [facility]  [facility]  [address] [place], [place] [address] [phone]  7:52 PM

## 2024-03-03 ENCOUNTER — Ambulatory Visit
Admission: RE | Admit: 2024-03-03 | Discharge: 2024-03-03 | Disposition: A | Discharge status: Home / Self Care | Source: Ambulatory Visit | Attending: Radiation Oncology | Admitting: Radiation Oncology

## 2024-03-03 DIAGNOSIS — Z510 Encounter for antineoplastic radiation therapy: Secondary | ICD-10-CM | POA: Diagnosis present

## 2024-03-03 DIAGNOSIS — C61 Malignant neoplasm of prostate: Secondary | ICD-10-CM | POA: Insufficient documentation

## 2024-03-03 NOTE — Progress Notes (Signed)
  Radiation Oncology         ([phone] ________________________________  Name: Raymond Vega MRN: [id number]  Date: 03/03/2024  DOB: [birth year]  SIMULATION AND TREATMENT PLANNING NOTE    ICD-10-CM   1. Primary prostate cancer (HCC)  C61       DIAGNOSIS:   73 y.o. gentleman with Stage T1c adenocarcinoma of the prostate with Gleason score of 4+3, and PSA of 15.7.   NARRATIVE:  The patient was brought to the CT Simulation planning suite.  Identity was confirmed.  All relevant records and images related to the planned course of therapy were reviewed.  The patient freely provided informed written consent to proceed with treatment after reviewing the details related to the planned course of therapy. The consent form was witnessed and verified by the simulation staff.  Then, the patient was set-up in a stable reproducible supine position for radiation therapy.  A vacuum lock pillow device was custom fabricated to position his legs in a reproducible immobilized position.  Then, I performed a urethrogram under sterile conditions to identify the prostatic apex.  CT images were obtained.  Surface markings were placed.  The CT images were loaded into the planning software.  Then the prostate target and avoidance structures including the rectum, bladder, bowel and hips were contoured.  Treatment planning then occurred.  The radiation prescription was entered and confirmed.  A total of one complex treatment devices was fabricated. I have requested : Intensity Modulated Radiotherapy (IMRT) is medically necessary for this case for the following reason:  Rectal sparing.SABRA  PLAN:  The patient will receive 70 Gy in 28 fractions; concurrent with ADT (got 6 month ELigard  ADT late 10/2023).  ________________________________  Donnice FELIX Patrcia, M.D.

## 2024-03-04 ENCOUNTER — Ambulatory Visit (INDEPENDENT_AMBULATORY_CARE_PROVIDER_SITE_OTHER): Admitting: Family Medicine

## 2024-03-04 ENCOUNTER — Encounter: Payer: Self-pay | Admitting: Family Medicine

## 2024-03-04 VITALS — BP 155/74 | HR 66 | Temp 98.2°F | Ht 71.0 in | Wt 184.0 lb

## 2024-03-04 DIAGNOSIS — C61 Malignant neoplasm of prostate: Secondary | ICD-10-CM

## 2024-03-04 DIAGNOSIS — R748 Abnormal levels of other serum enzymes: Secondary | ICD-10-CM | POA: Diagnosis not present

## 2024-03-04 DIAGNOSIS — E782 Mixed hyperlipidemia: Secondary | ICD-10-CM

## 2024-03-04 DIAGNOSIS — R0981 Nasal congestion: Secondary | ICD-10-CM

## 2024-03-04 MED ORDER — FLUTICASONE PROPIONATE 50 MCG/ACT NA SUSP: 2.0000 | Freq: Every day | NASAL | 6 refills | Status: AC

## 2024-03-04 MED ORDER — EZETIMIBE 10 MG PO TABS: 10.0000 mg | ORAL_TABLET | Freq: Every day | ORAL | 3 refills | Status: AC

## 2024-03-04 NOTE — Progress Notes (Signed)
 Subjective:  Patient ID: Raymond Vega, male    DOB: 09/19/1950  Age: 73 y.o. MRN: [id number]  CC: Medical Management of Chronic Issues   HPI  Discussed the use of AI scribe software for clinical note transcription with the patient, who gave verbal consent to proceed.  History of Present Illness Raymond Vega is a 73 year old male who presents for management of his cholesterol levels and discussion of a liver ultrasound.  His LDL cholesterol is elevated at 155 mg/dL, while his HDL cholesterol is at a favorable level of 52 mg/dL. He previously discontinued cholesterol medication due to elevated liver enzymes, which have since normalized. He adheres to a strict diet to manage his liver health and cholesterol levels.  He has a history of liver problems and is concerned about the possibility of fatty liver contributing to his elevated liver enzymes. His liver enzyme levels decreased quickly after stopping the cholesterol medication and starting a strict diet. He is considering alternative cholesterol-lowering medications, as his cholesterol remains high. He has been taking fish oil tablets and plans to continue this regimen.  He requests a refill of Flonase , which he uses to help with sleep, especially as he prepares to start cancer treatment. His cancer treatment, including radiation and possibly seed implants, has been delayed due to logistical issues. He plans to travel overseas after his treatment and wants to ensure all his medical needs are addressed before his departure.  He requests a follow-up blood test for liver function, PSA, testosterone, and cholesterol levels to be scheduled for December 9th, as he plans to travel shortly after. He reports that his testosterone levels are low and that he is receiving hormone injections as part of his cancer treatment.          02/24/2024    1:04 PM 02/07/2024    9:32 AM 01/22/2024    4:12 PM  Depression screen PHQ 2/9  Decreased Interest  0 0 0  Down, Depressed, Hopeless 1 0 0  PHQ - 2 Score 1 0 0  Altered sleeping  1 0  Tired, decreased energy  3 1  Change in appetite  3 0  Feeling bad or failure about yourself   0 0  Trouble concentrating  0 0  Moving slowly or fidgety/restless  0 0  Suicidal thoughts  0 0  PHQ-9 Score  7 1  Difficult doing work/chores  Somewhat difficult     History [name] has a past medical history of Dysrhythmia, GERD (gastroesophageal reflux disease), Hypertension, Prostate cancer (HCC), and Sleep apnea.   He has a past surgical history that includes Nasal fracture surgery; Fracture surgery; Circumcision; Band hemorrhoidectomy; Olecranon bursectomy (Right, 11/18/2015); Nerve and artery repair (Left, 12/24/2016); THUMB SURGERY (Left); LEFT FINGER SURGERY (Left); Shoulder arthroscopy (Left, 01/15/2023); and Cosmetic surgery.   His family history includes Heart attack (age of onset: 12) in his brother; Pneumonia in his father.He reports that he has never smoked. He has never used smokeless tobacco. He reports current alcohol use. He reports that he does not use drugs.    ROS Review of Systems  Constitutional:  Negative for fever.  HENT:  Positive for congestion, postnasal drip and rhinorrhea.   Respiratory:  Negative for shortness of breath.   Cardiovascular:  Negative for chest pain.  Musculoskeletal:  Negative for arthralgias.  Skin:  Negative for rash.    Objective:  BP (!) 155/74   Pulse 66   Temp 98.2 F (36.8 C)   Ht 5'  11 (1.803 m)   Wt 184 lb (83.5 kg)   SpO2 99%   BMI 25.66 kg/m   BP Readings from Last 3 Encounters:  03/04/24 (!) 155/74  03/02/24 (!) 143/67  02/24/24 (!) 145/87    Wt Readings from Last 3 Encounters:  03/04/24 184 lb (83.5 kg)  02/24/24 181 lb 3.2 oz (82.2 kg)  02/14/24 182 lb (82.6 kg)     Physical Exam Physical Exam GENERAL: Alert, cooperative, well developed, no acute distress HEENT: Normocephalic, CHEST: Clear to auscultation bilaterally, No  wheezes, rhonchi, or crackles CARDIOVASCULAR: Normal heart rate and rhythm, S1 and S2 normal without murmurs ABDOMEN: Soft,  non-distended EXTREMITIES: No cyanosis or edema NEUROLOGICAL: Cranial nerves grossly intact, Moves all extremities without gross motor or sensory deficit   Assessment & Plan:  Mixed hyperlipidemia -     CMP14+EGFR; Future -     Lipid panel; Future  Nasal congestion -     Fluticasone  Propionate; Place 2 sprays into both nostrils daily.  Dispense: 16 g; Refill: 6  Primary prostate cancer (HCC) -     PSA, total and free; Future -     Testosterone,Free and Total; Future  Elevated liver enzymes -     CMP14+EGFR; Future  Other orders -     Ezetimibe; Take 1 tablet (10 mg total) by mouth daily. For cholesterol  Dispense: 90 tablet; Refill: 3    Assessment and Plan Assessment & Plan Primary prostate cancer   Prostate cancer treatment has been delayed due to the unavailability of seed implants. The plan includes radiation therapy and a potential seed implant at a facility in [place]. Order PSA and testosterone levels on December 9th. Schedule a follow-up appointment on December 11th to review lab results and discuss treatment progress.  Hyperlipidemia   LDL cholesterol remains elevated at 155 mg/dL, posing a high risk for cardiovascular events. Previous statin therapy was discontinued due to elevated liver enzymes, which have since normalized. Zetia was chosen for its safety profile and lack of hepatic metabolism, though it may cause gastrointestinal side effects and has limited efficacy in reducing LDL cholesterol produced by the liver. Mega Red omega-3 supplements were recommended to enhance lipid-lowering effects. Prescribe Zetia to be filled at [facility]. Recommend Mega Red omega-3 supplements. Monitor liver function tests in six weeks. Order a cholesterol panel on December 9th to evaluate response to treatment.  Nasal congestion   Discussed the importance  of sleep and managing nasal congestion to improve overall well-being, especially in the context of upcoming cancer treatment. Prescribe Flonase  to manage nasal congestion and improve sleep quality.       Follow-up: No follow-ups on file.  Butler Der, M.D.

## 2024-03-05 DIAGNOSIS — C61 Malignant neoplasm of prostate: Secondary | ICD-10-CM | POA: Diagnosis not present

## 2024-03-06 ENCOUNTER — Ambulatory Visit: Payer: Self-pay | Admitting: Cardiology

## 2024-03-06 ENCOUNTER — Telehealth: Payer: Self-pay | Admitting: Cardiology

## 2024-03-06 DIAGNOSIS — R911 Solitary pulmonary nodule: Secondary | ICD-10-CM

## 2024-03-06 NOTE — Telephone Encounter (Signed)
 Pt is requesting a callback regarding him stating he was supposed to get a call from MD regarding results but he still hasn't heard anything. Please advise

## 2024-03-09 NOTE — Telephone Encounter (Signed)
 See result follow up from 10/24

## 2024-03-09 NOTE — Telephone Encounter (Signed)
 Spoke with pt regarding his results. Non contrasted CT ordered for 1 year out. Pt requested that Dr. Lavona call him to review his results. The pt did not want to make a follow up appointment at this time. Pt was told that Dr. Lavona would be notified of his request. Pt verbalized understanding. All questions if any were answered. Results sent to PCP.

## 2024-03-09 NOTE — Telephone Encounter (Signed)
-----   Message from Lynwood Schilling sent at 03/06/2024  8:40 AM EDT ----- He has mild to moderate non obstructive plaque in all three vessels.    He does not need any further procedures but he needs aggressive risk reduction.  I would like to see him back in Nov/Dec to  review.  Call Mr. Denner with the results and send results to Zollie Lowers, MD ----- Message ----- From: Interface, Rad Results In Sent: 03/02/2024   7:54 PM EDT To: Lynwood Schilling, MD

## 2024-03-10 ENCOUNTER — Ambulatory Visit

## 2024-03-11 ENCOUNTER — Other Ambulatory Visit: Payer: Self-pay

## 2024-03-11 ENCOUNTER — Ambulatory Visit
Admission: RE | Admit: 2024-03-11 | Discharge: 2024-03-11 | Disposition: A | Discharge status: Home / Self Care | Source: Ambulatory Visit | Attending: Radiation Oncology | Admitting: Radiation Oncology

## 2024-03-11 DIAGNOSIS — C61 Malignant neoplasm of prostate: Secondary | ICD-10-CM | POA: Diagnosis not present

## 2024-03-11 LAB — RAD ONC ARIA SESSION SUMMARY
Course Elapsed Days: 0
Plan Fractions Treated to Date: 1
Plan Prescribed Dose Per Fraction: 2.5 Gy
Plan Total Fractions Prescribed: 28
Plan Total Prescribed Dose: 70 Gy
Reference Point Dosage Given to Date: 2.5 Gy
Reference Point Session Dosage Given: 2.5 Gy
Session Number: 1

## 2024-03-12 ENCOUNTER — Other Ambulatory Visit: Payer: Self-pay

## 2024-03-12 ENCOUNTER — Ambulatory Visit: Admission: RE | Admit: 2024-03-12 | Discharge: 2024-03-12 | Attending: Radiation Oncology

## 2024-03-12 DIAGNOSIS — C61 Malignant neoplasm of prostate: Secondary | ICD-10-CM | POA: Diagnosis not present

## 2024-03-12 LAB — RAD ONC ARIA SESSION SUMMARY
Course Elapsed Days: 1
Plan Fractions Treated to Date: 2
Plan Prescribed Dose Per Fraction: 2.5 Gy
Plan Total Fractions Prescribed: 28
Plan Total Prescribed Dose: 70 Gy
Reference Point Dosage Given to Date: 5 Gy
Reference Point Session Dosage Given: 2.5 Gy
Session Number: 2

## 2024-03-13 ENCOUNTER — Ambulatory Visit

## 2024-03-13 ENCOUNTER — Other Ambulatory Visit: Payer: Self-pay

## 2024-03-13 ENCOUNTER — Ambulatory Visit
Admission: RE | Admit: 2024-03-13 | Discharge: 2024-03-13 | Disposition: A | Discharge status: Home / Self Care | Source: Ambulatory Visit | Attending: Radiation Oncology | Admitting: Radiation Oncology

## 2024-03-13 DIAGNOSIS — C61 Malignant neoplasm of prostate: Secondary | ICD-10-CM | POA: Diagnosis not present

## 2024-03-13 LAB — RAD ONC ARIA SESSION SUMMARY
Course Elapsed Days: 2
Plan Fractions Treated to Date: 3
Plan Prescribed Dose Per Fraction: 2.5 Gy
Plan Total Fractions Prescribed: 28
Plan Total Prescribed Dose: 70 Gy
Reference Point Dosage Given to Date: 7.5 Gy
Reference Point Session Dosage Given: 2.5 Gy
Session Number: 3

## 2024-03-16 ENCOUNTER — Other Ambulatory Visit: Payer: Self-pay

## 2024-03-16 ENCOUNTER — Ambulatory Visit
Admission: RE | Admit: 2024-03-16 | Discharge: 2024-03-16 | Disposition: A | Discharge status: Home / Self Care | Source: Ambulatory Visit | Attending: Radiation Oncology | Admitting: Radiation Oncology

## 2024-03-16 DIAGNOSIS — Z510 Encounter for antineoplastic radiation therapy: Secondary | ICD-10-CM | POA: Insufficient documentation

## 2024-03-16 DIAGNOSIS — C61 Malignant neoplasm of prostate: Secondary | ICD-10-CM | POA: Insufficient documentation

## 2024-03-16 LAB — RAD ONC ARIA SESSION SUMMARY
Course Elapsed Days: 5
Plan Fractions Treated to Date: 4
Plan Prescribed Dose Per Fraction: 2.5 Gy
Plan Total Fractions Prescribed: 28
Plan Total Prescribed Dose: 70 Gy
Reference Point Dosage Given to Date: 10 Gy
Reference Point Session Dosage Given: 2.5 Gy
Session Number: 4

## 2024-03-17 ENCOUNTER — Ambulatory Visit
Admission: RE | Admit: 2024-03-17 | Discharge: 2024-03-17 | Disposition: A | Discharge status: Home / Self Care | Source: Ambulatory Visit | Attending: Radiation Oncology | Admitting: Radiation Oncology

## 2024-03-17 ENCOUNTER — Ambulatory Visit

## 2024-03-17 ENCOUNTER — Other Ambulatory Visit: Payer: Self-pay

## 2024-03-17 DIAGNOSIS — C61 Malignant neoplasm of prostate: Secondary | ICD-10-CM | POA: Diagnosis not present

## 2024-03-17 LAB — RAD ONC ARIA SESSION SUMMARY
Course Elapsed Days: 6
Plan Fractions Treated to Date: 5
Plan Prescribed Dose Per Fraction: 2.5 Gy
Plan Total Fractions Prescribed: 28
Plan Total Prescribed Dose: 70 Gy
Reference Point Dosage Given to Date: 12.5 Gy
Reference Point Session Dosage Given: 2.5 Gy
Session Number: 5

## 2024-03-18 ENCOUNTER — Other Ambulatory Visit: Payer: Self-pay

## 2024-03-18 ENCOUNTER — Ambulatory Visit
Admission: RE | Admit: 2024-03-18 | Discharge: 2024-03-18 | Disposition: A | Source: Ambulatory Visit | Attending: Radiation Oncology

## 2024-03-18 DIAGNOSIS — C61 Malignant neoplasm of prostate: Secondary | ICD-10-CM | POA: Diagnosis not present

## 2024-03-18 LAB — RAD ONC ARIA SESSION SUMMARY
Course Elapsed Days: 7
Plan Fractions Treated to Date: 6
Plan Prescribed Dose Per Fraction: 2.5 Gy
Plan Total Fractions Prescribed: 28
Plan Total Prescribed Dose: 70 Gy
Reference Point Dosage Given to Date: 15 Gy
Reference Point Session Dosage Given: 2.5 Gy
Session Number: 6

## 2024-03-19 ENCOUNTER — Other Ambulatory Visit: Payer: Self-pay

## 2024-03-19 ENCOUNTER — Ambulatory Visit
Admission: RE | Admit: 2024-03-19 | Discharge: 2024-03-19 | Disposition: A | Discharge status: Home / Self Care | Source: Ambulatory Visit | Attending: Radiation Oncology | Admitting: Radiation Oncology

## 2024-03-19 DIAGNOSIS — C61 Malignant neoplasm of prostate: Secondary | ICD-10-CM | POA: Diagnosis not present

## 2024-03-19 LAB — RAD ONC ARIA SESSION SUMMARY
Course Elapsed Days: 8
Plan Fractions Treated to Date: 7
Plan Prescribed Dose Per Fraction: 2.5 Gy
Plan Total Fractions Prescribed: 28
Plan Total Prescribed Dose: 70 Gy
Reference Point Dosage Given to Date: 17.5 Gy
Reference Point Session Dosage Given: 2.5 Gy
Session Number: 7

## 2024-03-20 ENCOUNTER — Ambulatory Visit
Admission: RE | Admit: 2024-03-20 | Discharge: 2024-03-20 | Disposition: A | Discharge status: Home / Self Care | Source: Ambulatory Visit | Attending: Radiation Oncology | Admitting: Radiation Oncology

## 2024-03-20 ENCOUNTER — Other Ambulatory Visit: Payer: Self-pay

## 2024-03-20 DIAGNOSIS — C61 Malignant neoplasm of prostate: Secondary | ICD-10-CM | POA: Diagnosis not present

## 2024-03-20 LAB — RAD ONC ARIA SESSION SUMMARY
Course Elapsed Days: 9
Plan Fractions Treated to Date: 8
Plan Prescribed Dose Per Fraction: 2.5 Gy
Plan Total Fractions Prescribed: 28
Plan Total Prescribed Dose: 70 Gy
Reference Point Dosage Given to Date: 20 Gy
Reference Point Session Dosage Given: 2.5 Gy
Session Number: 8

## 2024-03-23 ENCOUNTER — Ambulatory Visit
Admission: RE | Admit: 2024-03-23 | Discharge: 2024-03-23 | Disposition: A | Discharge status: Home / Self Care | Source: Ambulatory Visit | Attending: Radiation Oncology | Admitting: Radiation Oncology

## 2024-03-23 ENCOUNTER — Other Ambulatory Visit: Payer: Self-pay

## 2024-03-23 DIAGNOSIS — C61 Malignant neoplasm of prostate: Secondary | ICD-10-CM | POA: Diagnosis not present

## 2024-03-23 LAB — RAD ONC ARIA SESSION SUMMARY
Course Elapsed Days: 12
Plan Fractions Treated to Date: 9
Plan Prescribed Dose Per Fraction: 2.5 Gy
Plan Total Fractions Prescribed: 28
Plan Total Prescribed Dose: 70 Gy
Reference Point Dosage Given to Date: 22.5 Gy
Reference Point Session Dosage Given: 2.5 Gy
Session Number: 9

## 2024-03-24 ENCOUNTER — Other Ambulatory Visit: Payer: Self-pay

## 2024-03-24 ENCOUNTER — Other Ambulatory Visit: Payer: Self-pay | Admitting: Urology

## 2024-03-24 ENCOUNTER — Ambulatory Visit
Admission: RE | Admit: 2024-03-24 | Discharge: 2024-03-24 | Disposition: A | Discharge status: Home / Self Care | Source: Ambulatory Visit | Attending: Radiation Oncology | Admitting: Radiation Oncology

## 2024-03-24 DIAGNOSIS — C61 Malignant neoplasm of prostate: Secondary | ICD-10-CM | POA: Diagnosis not present

## 2024-03-24 LAB — RAD ONC ARIA SESSION SUMMARY
Course Elapsed Days: 13
Plan Fractions Treated to Date: 10
Plan Prescribed Dose Per Fraction: 2.5 Gy
Plan Total Fractions Prescribed: 28
Plan Total Prescribed Dose: 70 Gy
Reference Point Dosage Given to Date: 25 Gy
Reference Point Session Dosage Given: 2.5 Gy
Session Number: 10

## 2024-03-24 MED ORDER — ONDANSETRON 4 MG PO TBDP: 4.0000 mg | ORAL_TABLET | Freq: Three times a day (TID) | ORAL | 0 refills | Status: AC | PRN

## 2024-03-24 NOTE — Progress Notes (Signed)
 Mr. Raymond Vega, 73 y.o. gentleman with Stage T1c adenocarcinoma of the prostate with Gleason score of 4+3, and PSA of 15.7.  Just had radiation treatment and left the building he was requesting nausea/vomiting [facility] [place].  Reports has been feeling nausea for couple days and thought he could wait for his undertreat appointment on Friday 03/27/2024, but he needs something today.  Reports he wasn't sure he would be able to do treatment today but got through it.  Denies any other concerns just nausea/vomiting.  RN to report request to [employer], PA-C.

## 2024-03-25 ENCOUNTER — Telehealth: Payer: Self-pay

## 2024-03-25 ENCOUNTER — Other Ambulatory Visit: Payer: Self-pay

## 2024-03-25 ENCOUNTER — Ambulatory Visit
Admission: RE | Admit: 2024-03-25 | Discharge: 2024-03-25 | Disposition: A | Discharge status: Home / Self Care | Source: Ambulatory Visit | Attending: Radiation Oncology | Admitting: Radiation Oncology

## 2024-03-25 DIAGNOSIS — C61 Malignant neoplasm of prostate: Secondary | ICD-10-CM | POA: Diagnosis not present

## 2024-03-25 LAB — RAD ONC ARIA SESSION SUMMARY
Course Elapsed Days: 14
Plan Fractions Treated to Date: 11
Plan Prescribed Dose Per Fraction: 2.5 Gy
Plan Total Fractions Prescribed: 28
Plan Total Prescribed Dose: 70 Gy
Reference Point Dosage Given to Date: 27.5 Gy
Reference Point Session Dosage Given: 2.5 Gy
Session Number: 11

## 2024-03-25 NOTE — Telephone Encounter (Signed)
 RN called patient to inform him request for nausea /vomiting medication was sent to his pharmacy in [place].  Patient reports he did receive notification to pick up medication yesterday evening.

## 2024-03-26 ENCOUNTER — Ambulatory Visit
Admission: RE | Admit: 2024-03-26 | Discharge: 2024-03-26 | Disposition: A | Discharge status: Home / Self Care | Source: Ambulatory Visit | Attending: Radiation Oncology | Admitting: Radiation Oncology

## 2024-03-26 ENCOUNTER — Other Ambulatory Visit: Payer: Self-pay

## 2024-03-26 ENCOUNTER — Telehealth: Payer: Self-pay | Admitting: Radiation Oncology

## 2024-03-26 DIAGNOSIS — C61 Malignant neoplasm of prostate: Secondary | ICD-10-CM | POA: Diagnosis not present

## 2024-03-26 LAB — RAD ONC ARIA SESSION SUMMARY
Course Elapsed Days: 15
Plan Fractions Treated to Date: 12
Plan Prescribed Dose Per Fraction: 2.5 Gy
Plan Total Fractions Prescribed: 28
Plan Total Prescribed Dose: 70 Gy
Reference Point Dosage Given to Date: 30 Gy
Reference Point Session Dosage Given: 2.5 Gy
Session Number: 12

## 2024-03-26 NOTE — Telephone Encounter (Signed)
 1/13 @ 10:00 am patient called with concerns with have txt with an empty bladder, due to feeling sick/nausea after txt.  Secure chat sent to nursing, so they are aware.

## 2024-03-27 ENCOUNTER — Other Ambulatory Visit: Payer: Self-pay

## 2024-03-27 ENCOUNTER — Ambulatory Visit
Admission: RE | Admit: 2024-03-27 | Discharge: 2024-03-27 | Disposition: A | Discharge status: Home / Self Care | Source: Ambulatory Visit | Attending: Radiation Oncology | Admitting: Radiation Oncology

## 2024-03-27 DIAGNOSIS — C61 Malignant neoplasm of prostate: Secondary | ICD-10-CM | POA: Diagnosis not present

## 2024-03-27 LAB — RAD ONC ARIA SESSION SUMMARY
Course Elapsed Days: 16
Plan Fractions Treated to Date: 13
Plan Prescribed Dose Per Fraction: 2.5 Gy
Plan Total Fractions Prescribed: 28
Plan Total Prescribed Dose: 70 Gy
Reference Point Dosage Given to Date: 32.5 Gy
Reference Point Session Dosage Given: 2.5 Gy
Session Number: 13

## 2024-03-30 ENCOUNTER — Other Ambulatory Visit: Payer: Self-pay

## 2024-03-30 ENCOUNTER — Ambulatory Visit
Admission: RE | Admit: 2024-03-30 | Discharge: 2024-03-30 | Disposition: A | Discharge status: Home / Self Care | Source: Ambulatory Visit | Attending: Radiation Oncology | Admitting: Radiation Oncology

## 2024-03-30 DIAGNOSIS — C61 Malignant neoplasm of prostate: Secondary | ICD-10-CM | POA: Diagnosis not present

## 2024-03-30 LAB — RAD ONC ARIA SESSION SUMMARY
Course Elapsed Days: 19
Plan Fractions Treated to Date: 14
Plan Prescribed Dose Per Fraction: 2.5 Gy
Plan Total Fractions Prescribed: 28
Plan Total Prescribed Dose: 70 Gy
Reference Point Dosage Given to Date: 35 Gy
Reference Point Session Dosage Given: 2.5 Gy
Session Number: 14

## 2024-03-31 ENCOUNTER — Ambulatory Visit
Admission: RE | Admit: 2024-03-31 | Discharge: 2024-03-31 | Disposition: A | Discharge status: Home / Self Care | Source: Ambulatory Visit | Attending: Radiation Oncology | Admitting: Radiation Oncology

## 2024-03-31 ENCOUNTER — Other Ambulatory Visit: Payer: Self-pay

## 2024-03-31 DIAGNOSIS — C61 Malignant neoplasm of prostate: Secondary | ICD-10-CM | POA: Diagnosis not present

## 2024-03-31 LAB — RAD ONC ARIA SESSION SUMMARY
Course Elapsed Days: 20
Plan Fractions Treated to Date: 15
Plan Prescribed Dose Per Fraction: 2.5 Gy
Plan Total Fractions Prescribed: 28
Plan Total Prescribed Dose: 70 Gy
Reference Point Dosage Given to Date: 37.5 Gy
Reference Point Session Dosage Given: 2.5 Gy
Session Number: 15

## 2024-04-01 ENCOUNTER — Ambulatory Visit
Admission: RE | Admit: 2024-04-01 | Discharge: 2024-04-01 | Disposition: A | Discharge status: Home / Self Care | Source: Ambulatory Visit | Attending: Radiation Oncology | Admitting: Radiation Oncology

## 2024-04-01 ENCOUNTER — Other Ambulatory Visit: Payer: Self-pay

## 2024-04-01 DIAGNOSIS — C61 Malignant neoplasm of prostate: Secondary | ICD-10-CM | POA: Diagnosis not present

## 2024-04-01 LAB — RAD ONC ARIA SESSION SUMMARY
Course Elapsed Days: 21
Plan Fractions Treated to Date: 16
Plan Prescribed Dose Per Fraction: 2.5 Gy
Plan Total Fractions Prescribed: 28
Plan Total Prescribed Dose: 70 Gy
Reference Point Dosage Given to Date: 40 Gy
Reference Point Session Dosage Given: 2.5 Gy
Session Number: 16

## 2024-04-02 ENCOUNTER — Other Ambulatory Visit: Payer: Self-pay

## 2024-04-02 ENCOUNTER — Ambulatory Visit
Admission: RE | Admit: 2024-04-02 | Discharge: 2024-04-02 | Disposition: A | Discharge status: Home / Self Care | Source: Ambulatory Visit | Attending: Radiation Oncology | Admitting: Radiation Oncology

## 2024-04-02 DIAGNOSIS — R079 Chest pain, unspecified: Secondary | ICD-10-CM | POA: Insufficient documentation

## 2024-04-02 DIAGNOSIS — C61 Malignant neoplasm of prostate: Secondary | ICD-10-CM | POA: Diagnosis not present

## 2024-04-02 LAB — RAD ONC ARIA SESSION SUMMARY
Course Elapsed Days: 22
Plan Fractions Treated to Date: 17
Plan Prescribed Dose Per Fraction: 2.5 Gy
Plan Total Fractions Prescribed: 28
Plan Total Prescribed Dose: 70 Gy
Reference Point Dosage Given to Date: 42.5 Gy
Reference Point Session Dosage Given: 2.5 Gy
Session Number: 17

## 2024-04-02 NOTE — Progress Notes (Signed)
 Cardiology Office Note:   Date:  04/03/2024  ID:  Raymond Vega, DOB 04/14/1951, MRN [id number] PCP: Raymond Lowers, MD  [facility] Providers Cardiologist:  Lynwood Schilling, MD {  History of Present Illness:   Raymond Vega is a 73 y.o. male who presents for evaluation of atrial fib. He was noted to be in this in [place].  He saw Dr. Sheena in April 2025 and was started on Cardizem  but he wanted to hold off on DOAC.  Echo was unremarkable.  He did have a follow-up monitor.  This demonstrated 1% burden of atrial fibrillation flutter.  Average was 123 bpm.  The longest was 23 minutes and 43 seconds.  He wore the monitor for 13 days.  I had seen him in 2023.  In March of that year he had atrial fibrillation but he converted spontaneously prior to getting to the emergency room.  He had been sick at that time and was hypokalemic in the emergency room.  He eventually had increased episodes and was started on DOAC.    He had chest pain and had moderate non obstructive plaque in all three vessels.  This was on a CT coronary done in October 2025.  He had an echo in April 2025 with a well-preserved ejection fraction.  There were no significant valvular abnormalities.  Left atrium size was normal.  At the last visit we had long discussion about anticoagulation.  Since then he has had radiation therapy for prostate cancer.  He did not start his DOAC because he thought he was at risk for hematuria and wanted to wait on this.  He also was worried about the risk of bleeding.  His blood pressure has not been well-controlled.  He says however, he would like to stop the HCTZ because he is urinating all the time.  He is not having any new chest pressure, neck or arm discomfort.  He is not having any new palpitations, presyncope or syncope.  He has had no weight gain or edema.  ROS: As stated in the HPI and negative for all other systems.  Studies Reviewed:    EKG:     NA  Risk Assessment/Calculations:     CHA2DS2-VASc Score = 2   This indicates a 2.2% annual risk of stroke. The patient's score is based upon: CHF History: 0 HTN History: 1 Diabetes History: 0 Stroke History: 0 Vascular Disease History: 0 Age Score: 1 Gender Score: 0       Physical Exam:   VS:  BP (!) 150/98   Pulse 69   Ht 5' 11 (1.803 m)   Wt 185 lb 12.8 oz (84.3 kg)   SpO2 98%   BMI 25.91 kg/m    Wt Readings from Last 3 Encounters:  04/03/24 185 lb 12.8 oz (84.3 kg)  03/04/24 184 lb (83.5 kg)  02/24/24 181 lb 3.2 oz (82.2 kg)     GEN: Well nourished, well developed in no acute distress NECK: No JVD; No carotid bruits CARDIAC: RRR, no murmurs, rubs, gallops RESPIRATORY:  Clear to auscultation without rales, wheezing or rhonchi  ABDOMEN: Soft, non-tender, non-distended EXTREMITIES:  No edema; No deformity   ASSESSMENT AND PLAN:       PAF:   We had a long discussion about this and I have suggested DOAC but he does not want to do this.  He will continue to consider this.  He is monitoring himself on his Apple watch and says that he is less than 2% of  the time in A-fib.  He has no symptoms related to this.   Hypertension: His blood pressure is not controlled but he does not want to take the HCTZ portion of his valsartan  HCT.  At the last visit I did increase his Cardizem .  Today I will stop the HCTZ portion and only start the valsartan .  I am going to add a Catapres  patch #1.  He can then keep a blood pressure diary.   Chest pain: He has nonobstructive coronary disease.  We had a long discussion about this.  He needs primary risk reduction.  Dyslipidemia: He does not tolerate statins.  His LDL recently was 155.  I am going to start him on Repatha  and he can have a repeat lipid in 3 months.  He can also be on the Zetia .    Follow up with the hypertension clinic in a couple of months.  Signed, Lynwood Schilling, MD

## 2024-04-03 ENCOUNTER — Other Ambulatory Visit (HOSPITAL_COMMUNITY): Payer: Self-pay

## 2024-04-03 ENCOUNTER — Ambulatory Visit
Admission: RE | Admit: 2024-04-03 | Discharge: 2024-04-03 | Disposition: A | Discharge status: Home / Self Care | Source: Ambulatory Visit | Attending: Radiation Oncology | Admitting: Radiation Oncology

## 2024-04-03 ENCOUNTER — Telehealth: Payer: Self-pay | Admitting: Pharmacy Technician

## 2024-04-03 ENCOUNTER — Encounter: Payer: Self-pay | Admitting: Cardiology

## 2024-04-03 ENCOUNTER — Other Ambulatory Visit

## 2024-04-03 ENCOUNTER — Ambulatory Visit: Attending: Cardiology | Admitting: Cardiology

## 2024-04-03 ENCOUNTER — Other Ambulatory Visit: Payer: Self-pay

## 2024-04-03 VITALS — BP 150/98 | HR 69 | Ht 71.0 in | Wt 185.8 lb

## 2024-04-03 DIAGNOSIS — E782 Mixed hyperlipidemia: Secondary | ICD-10-CM

## 2024-04-03 DIAGNOSIS — R079 Chest pain, unspecified: Secondary | ICD-10-CM | POA: Diagnosis not present

## 2024-04-03 DIAGNOSIS — I4891 Unspecified atrial fibrillation: Secondary | ICD-10-CM

## 2024-04-03 DIAGNOSIS — C61 Malignant neoplasm of prostate: Secondary | ICD-10-CM

## 2024-04-03 DIAGNOSIS — I10 Essential (primary) hypertension: Secondary | ICD-10-CM | POA: Diagnosis not present

## 2024-04-03 DIAGNOSIS — R748 Abnormal levels of other serum enzymes: Secondary | ICD-10-CM

## 2024-04-03 LAB — RAD ONC ARIA SESSION SUMMARY
Course Elapsed Days: 23
Plan Fractions Treated to Date: 18
Plan Prescribed Dose Per Fraction: 2.5 Gy
Plan Total Fractions Prescribed: 28
Plan Total Prescribed Dose: 70 Gy
Reference Point Dosage Given to Date: 45 Gy
Reference Point Session Dosage Given: 2.5 Gy
Session Number: 18

## 2024-04-03 MED ORDER — REPATHA SURECLICK 140 MG/ML ~~LOC~~ SOAJ
140.0000 mg | SUBCUTANEOUS | 6 refills | Status: DC
  Filled 2024-04-03: qty 2, 28d supply, fill #0
  Filled 2024-04-03: qty 6, 84d supply, fill #0

## 2024-04-03 MED ORDER — VALSARTAN 320 MG PO TABS: 320.0000 mg | ORAL_TABLET | Freq: Every day | ORAL | 3 refills | Status: AC

## 2024-04-03 MED ORDER — VALSARTAN 320 MG PO TABS
320.0000 mg | ORAL_TABLET | Freq: Every day | ORAL | 3 refills | Status: DC
  Filled 2024-04-03: qty 60, 60d supply, fill #0

## 2024-04-03 MED ORDER — CLONIDINE 0.1 MG/24HR TD PTWK
0.1000 mg | MEDICATED_PATCH | TRANSDERMAL | 12 refills | Status: DC
  Filled 2024-04-03: qty 4, 28d supply, fill #0

## 2024-04-03 MED ORDER — CLONIDINE 0.1 MG/24HR TD PTWK: 0.1000 mg | MEDICATED_PATCH | TRANSDERMAL | 12 refills | Status: DC

## 2024-04-03 NOTE — Telephone Encounter (Signed)
 Patient in lobby, just had visit with Dr. Lavona. Patient asking about status of repatha  approval, advised that per Camelia Slade in prior auth department his repatha  was approved today for a 30 day supply for a cost of $133.60.   Patient is asking if he can potentially get approved for a 90 day supply as he leaving the ocuntry for a few weeks on 04/21/24. He states that if he cannot get a 90 day supply he would rather just try another medication.   He also requests refills of his valsartan  and clonidine  be sent to [facility] instead of to [facility]. Orders updated in encounter.

## 2024-04-03 NOTE — Patient Instructions (Signed)
 Medication Instructions:  Your physician has recommended you make the following change in your medication:  1) START taking Repatha  140 mg/mL injections every 14 days  2) START using Catpress (clonidine ) 0.1 mg patch once weekly  3) STOP taking valsartan -hydrochlorothiazide   4) START taking valsartan  320 mg once daily  *If you need a refill on your cardiac medications before your next appointment, please call your pharmacy*  Follow-Up: At [facility], you and your health needs are our priority.  As part of our continuing mission to provide you with exceptional heart care, our providers are all part of one team.  This team includes your primary Cardiologist (physician) and Advanced Practice Providers or APPs (Physician Assistants and Nurse Practitioners) who all work together to provide you with the care you need, when you need it.  Your next appointment:   2 months   Provider:   PharmD - Hypertension Clinic

## 2024-04-03 NOTE — Telephone Encounter (Signed)
 Pharmacy Patient Advocate Encounter  Received notification from [employer] that Prior Authorization for Repatha  has been APPROVED from 01/04/24 to 04/03/25. Ran test claim, Copay is $133.60- one month. This test claim was processed through [facility]- copay amounts may vary at other pharmacies due to pharmacy/plan contracts, or as the patient moves through the different stages of their insurance plan.   PA #/Case ID/Reference #: [id number]

## 2024-04-03 NOTE — Telephone Encounter (Signed)
 Pharmacy Patient Advocate Encounter   Received notification from Patient Pharmacy that prior authorization for Repatha  is required/requested.   Insurance verification completed.   The patient is insured through [employer].   Per test claim: PA required; PA submitted to above mentioned insurance via Latent Key/confirmation #/EOC [id number] Status is pending

## 2024-04-03 NOTE — Addendum Note (Signed)
 Addended by: JANIT GENI CROME on: 04/03/2024 01:40 PM   Modules accepted: Orders

## 2024-04-05 ENCOUNTER — Other Ambulatory Visit: Payer: Self-pay

## 2024-04-05 ENCOUNTER — Ambulatory Visit
Admission: RE | Admit: 2024-04-05 | Discharge: 2024-04-05 | Disposition: A | Discharge status: Home / Self Care | Source: Ambulatory Visit | Attending: Radiation Oncology | Admitting: Radiation Oncology

## 2024-04-05 DIAGNOSIS — C61 Malignant neoplasm of prostate: Secondary | ICD-10-CM | POA: Diagnosis not present

## 2024-04-05 LAB — RAD ONC ARIA SESSION SUMMARY
Course Elapsed Days: 25
Plan Fractions Treated to Date: 19
Plan Prescribed Dose Per Fraction: 2.5 Gy
Plan Total Fractions Prescribed: 28
Plan Total Prescribed Dose: 70 Gy
Reference Point Dosage Given to Date: 47.5 Gy
Reference Point Session Dosage Given: 2.5 Gy
Session Number: 19

## 2024-04-06 ENCOUNTER — Other Ambulatory Visit (HOSPITAL_COMMUNITY): Payer: Self-pay

## 2024-04-06 ENCOUNTER — Other Ambulatory Visit: Payer: Self-pay | Admitting: Cardiology

## 2024-04-06 ENCOUNTER — Ambulatory Visit
Admission: RE | Admit: 2024-04-06 | Discharge: 2024-04-06 | Disposition: A | Discharge status: Home / Self Care | Source: Ambulatory Visit | Attending: Radiation Oncology | Admitting: Radiation Oncology

## 2024-04-06 ENCOUNTER — Other Ambulatory Visit: Payer: Self-pay

## 2024-04-06 DIAGNOSIS — C61 Malignant neoplasm of prostate: Secondary | ICD-10-CM | POA: Diagnosis not present

## 2024-04-06 DIAGNOSIS — I10 Essential (primary) hypertension: Secondary | ICD-10-CM

## 2024-04-06 LAB — RAD ONC ARIA SESSION SUMMARY
Course Elapsed Days: 26
Course Elapsed Days: 26
Plan Fractions Treated to Date: 20
Plan Fractions Treated to Date: 21
Plan Prescribed Dose Per Fraction: 2.5 Gy
Plan Prescribed Dose Per Fraction: 2.5 Gy
Plan Total Fractions Prescribed: 28
Plan Total Fractions Prescribed: 28
Plan Total Prescribed Dose: 70 Gy
Plan Total Prescribed Dose: 70 Gy
Reference Point Dosage Given to Date: 50 Gy
Reference Point Dosage Given to Date: 52.5 Gy
Reference Point Session Dosage Given: 2.5 Gy
Reference Point Session Dosage Given: 2.5 Gy
Session Number: 20
Session Number: 21

## 2024-04-06 LAB — LIPID PANEL
Chol/HDL Ratio: 3 ratio (ref 0.0–5.0)
Cholesterol, Total: 163 mg/dL (ref 100–199)
HDL: 54 mg/dL (ref >39)
LDL Chol Calc (NIH): 98 mg/dL (ref 0–99)
Triglycerides: 54 mg/dL (ref 0–149)
VLDL Cholesterol Cal: 11 mg/dL (ref 5–40)

## 2024-04-06 LAB — CMP14+EGFR
ALT: 25 IU/L (ref 0–44)
AST: 24 IU/L (ref 0–40)
Albumin: 4.2 g/dL (ref 3.8–4.8)
Alkaline Phosphatase: 65 IU/L (ref 47–123)
BUN/Creatinine Ratio: 22 (ref 10–24)
BUN: 21 mg/dL (ref 8–27)
Bilirubin Total: 0.5 mg/dL (ref 0.0–1.2)
CO2: 25 mmol/L (ref 20–29)
Calcium: 9.4 mg/dL (ref 8.6–10.2)
Chloride: 100 mmol/L (ref 96–106)
Creatinine, Ser: 0.97 mg/dL (ref 0.76–1.27)
Globulin, Total: 2.2 g/dL (ref 1.5–4.5)
Glucose: 94 mg/dL (ref 70–99)
Potassium: 3.6 mmol/L (ref 3.5–5.2)
Sodium: 138 mmol/L (ref 134–144)
Total Protein: 6.4 g/dL (ref 6.0–8.5)
eGFR: 83 mL/min/1.73 (ref >59)

## 2024-04-06 LAB — TESTOSTERONE,FREE AND TOTAL
Testosterone, Free: <0.2 pg/mL — AB (ref 6.6–18.1)
Testosterone: <3 ng/dL — AB (ref 264–916)

## 2024-04-06 LAB — PSA, TOTAL AND FREE
PSA, Free Pct: 12 %
PSA, Free: 0.06 ng/mL
Prostate Specific Ag, Serum: 0.5 ng/mL (ref 0.0–4.0)

## 2024-04-07 ENCOUNTER — Ambulatory Visit
Admission: RE | Admit: 2024-04-07 | Discharge: 2024-04-07 | Disposition: A | Source: Ambulatory Visit | Attending: Radiation Oncology

## 2024-04-07 ENCOUNTER — Other Ambulatory Visit (HOSPITAL_COMMUNITY): Payer: Self-pay

## 2024-04-07 ENCOUNTER — Other Ambulatory Visit: Payer: Self-pay

## 2024-04-07 DIAGNOSIS — C61 Malignant neoplasm of prostate: Secondary | ICD-10-CM | POA: Diagnosis not present

## 2024-04-07 LAB — RAD ONC ARIA SESSION SUMMARY
Course Elapsed Days: 27
Plan Fractions Treated to Date: 22
Plan Prescribed Dose Per Fraction: 2.5 Gy
Plan Total Fractions Prescribed: 28
Plan Total Prescribed Dose: 70 Gy
Reference Point Dosage Given to Date: 55 Gy
Reference Point Session Dosage Given: 2.5 Gy
Session Number: 22

## 2024-04-07 MED ORDER — CLONIDINE 0.1 MG/24HR TD PTWK
0.1000 mg | MEDICATED_PATCH | TRANSDERMAL | 3 refills | Status: AC
  Filled 2024-04-07: qty 12, 84d supply, fill #0

## 2024-04-08 ENCOUNTER — Ambulatory Visit: Admitting: Family Medicine

## 2024-04-08 ENCOUNTER — Other Ambulatory Visit: Payer: Self-pay

## 2024-04-08 ENCOUNTER — Ambulatory Visit
Admission: RE | Admit: 2024-04-08 | Discharge: 2024-04-08 | Disposition: A | Discharge status: Home / Self Care | Source: Ambulatory Visit | Attending: Radiation Oncology | Admitting: Radiation Oncology

## 2024-04-08 DIAGNOSIS — C61 Malignant neoplasm of prostate: Secondary | ICD-10-CM | POA: Diagnosis not present

## 2024-04-08 LAB — RAD ONC ARIA SESSION SUMMARY
Course Elapsed Days: 28
Plan Fractions Treated to Date: 23
Plan Prescribed Dose Per Fraction: 2.5 Gy
Plan Total Fractions Prescribed: 28
Plan Total Prescribed Dose: 70 Gy
Reference Point Dosage Given to Date: 57.5 Gy
Reference Point Session Dosage Given: 2.5 Gy
Session Number: 23

## 2024-04-13 ENCOUNTER — Ambulatory Visit
Admission: RE | Admit: 2024-04-13 | Discharge: 2024-04-13 | Disposition: A | Source: Ambulatory Visit | Attending: Radiation Oncology

## 2024-04-13 ENCOUNTER — Ambulatory Visit
Admission: RE | Admit: 2024-04-13 | Discharge: 2024-04-13 | Disposition: A | Discharge status: Home / Self Care | Source: Ambulatory Visit | Attending: Radiation Oncology | Admitting: Radiation Oncology

## 2024-04-13 ENCOUNTER — Other Ambulatory Visit: Payer: Self-pay

## 2024-04-13 DIAGNOSIS — C61 Malignant neoplasm of prostate: Secondary | ICD-10-CM | POA: Insufficient documentation

## 2024-04-13 DIAGNOSIS — Z510 Encounter for antineoplastic radiation therapy: Secondary | ICD-10-CM | POA: Diagnosis present

## 2024-04-13 LAB — RAD ONC ARIA SESSION SUMMARY
Course Elapsed Days: 33
Course Elapsed Days: 33
Plan Fractions Treated to Date: 24
Plan Fractions Treated to Date: 25
Plan Prescribed Dose Per Fraction: 2.5 Gy
Plan Prescribed Dose Per Fraction: 2.5 Gy
Plan Total Fractions Prescribed: 28
Plan Total Fractions Prescribed: 28
Plan Total Prescribed Dose: 70 Gy
Plan Total Prescribed Dose: 70 Gy
Reference Point Dosage Given to Date: 60 Gy
Reference Point Dosage Given to Date: 62.5 Gy
Reference Point Session Dosage Given: 2.5 Gy
Reference Point Session Dosage Given: 2.5 Gy
Session Number: 24
Session Number: 25

## 2024-04-14 ENCOUNTER — Ambulatory Visit: Admitting: Family Medicine

## 2024-04-14 ENCOUNTER — Ambulatory Visit
Admission: RE | Admit: 2024-04-14 | Discharge: 2024-04-14 | Disposition: A | Source: Ambulatory Visit | Attending: Radiation Oncology

## 2024-04-14 ENCOUNTER — Other Ambulatory Visit: Payer: Self-pay

## 2024-04-14 ENCOUNTER — Ambulatory Visit

## 2024-04-14 VITALS — BP 144/79 | HR 69 | Temp 97.5°F | Ht 71.0 in | Wt 188.0 lb

## 2024-04-14 DIAGNOSIS — E782 Mixed hyperlipidemia: Secondary | ICD-10-CM | POA: Diagnosis not present

## 2024-04-14 DIAGNOSIS — I10 Essential (primary) hypertension: Secondary | ICD-10-CM

## 2024-04-14 DIAGNOSIS — C61 Malignant neoplasm of prostate: Secondary | ICD-10-CM

## 2024-04-14 DIAGNOSIS — R0981 Nasal congestion: Secondary | ICD-10-CM | POA: Diagnosis not present

## 2024-04-14 LAB — RAD ONC ARIA SESSION SUMMARY
Course Elapsed Days: 34
Plan Fractions Treated to Date: 26
Plan Prescribed Dose Per Fraction: 2.5 Gy
Plan Total Fractions Prescribed: 28
Plan Total Prescribed Dose: 70 Gy
Reference Point Dosage Given to Date: 65 Gy
Reference Point Session Dosage Given: 2.5 Gy
Session Number: 26

## 2024-04-14 MED ORDER — OMEPRAZOLE 20 MG PO CPDR: 20.0000 mg | DELAYED_RELEASE_CAPSULE | Freq: Every day | ORAL | 3 refills | Status: AC

## 2024-04-14 NOTE — Progress Notes (Signed)
 Subjective:  Patient ID: Raymond Vega, male    DOB: May 20, 1950  Age: 73 y.o. MRN: [id number]  CC: Medical Management of Chronic Issues   HPI  Discussed the use of AI scribe software for clinical note transcription with the patient, who gave verbal consent to proceed.  History of Present Illness Raymond Vega is a 73 year old male who presents for a follow-up on cholesterol management.  He has experienced a significant decrease in cholesterol levels since starting a new medication regimen, which includes Zetia  and rosuvastatin . Total cholesterol and LDL levels have reduced by 60 points. He has not started the recommended injectable medication due to cost and insurance coverage issues.  He has a history of elevated liver enzymes when previously taking rosuvastatin , which resolved after discontinuation. He attributes the liver enzyme elevation to a hormone injection received five months ago. He plans to travel to [place] for two and a half months and is considering taking rosuvastatin  every third day during his trip, as he can access liver function tests there if needed.  He has been taking omeprazole  for many years to manage reflux symptoms, which he finds effective. He is concerned about potential long-term effects, such as bone brittleness, and has experienced fractures in the past. He takes calcium  supplements to mitigate this risk and has been on omeprazole  for decades without significant issues.  He mentions a history of hormone injections that have suppressed his testosterone  levels for the past five months, with the expectation that levels will normalize by January.  He is managing high blood pressure and has been prescribed a clonidine  patch. He is monitoring his blood pressure closely, especially after the hormone injection wears off, to ensure it does not drop too low.          04/14/2024    1:43 PM 02/24/2024    1:04 PM 02/07/2024    9:32 AM  Depression screen PHQ 2/9   Decreased Interest 0 0 0  Down, Depressed, Hopeless 0 1 0  PHQ - 2 Score 0 1 0  Altered sleeping 0  1  Tired, decreased energy 3  3  Change in appetite 2  3  Feeling bad or failure about yourself  0  0  Trouble concentrating 0  0  Moving slowly or fidgety/restless 0  0  Suicidal thoughts 0  0  PHQ-9 Score 5  7   Difficult doing work/chores Not difficult at all  Somewhat difficult     Data saved with a previous flowsheet row definition    History Raymond Vega has a past medical history of Dysrhythmia, GERD (gastroesophageal reflux disease), Hypertension, Prostate cancer (HCC), and Sleep apnea.   He has a past surgical history that includes Nasal fracture surgery; Fracture surgery; Circumcision; Band hemorrhoidectomy; Olecranon bursectomy (Right, 11/18/2015); Nerve and artery repair (Left, 12/24/2016); THUMB SURGERY (Left); LEFT FINGER SURGERY (Left); Shoulder arthroscopy (Left, 01/15/2023); and Cosmetic surgery.   His family history includes Heart attack (age of onset: 7) in his brother; Pneumonia in his father.He reports that he has never smoked. He has never used smokeless tobacco. He reports current alcohol use. He reports that he does not use drugs.    ROS Review of Systems  Constitutional: Negative.   HENT: Negative.    Eyes:  Negative for visual disturbance.  Respiratory:  Negative for cough and shortness of breath.   Cardiovascular:  Negative for chest pain and leg swelling.  Gastrointestinal:  Negative for abdominal pain, diarrhea, nausea and vomiting.  Genitourinary:  Negative for difficulty urinating.  Musculoskeletal:  Negative for arthralgias and myalgias.  Skin:  Negative for rash.  Neurological:  Negative for headaches.  Psychiatric/Behavioral:  Negative for sleep disturbance.     Objective:  BP (!) 144/79   Pulse 69   Temp (!) 97.5 F (36.4 C)   Ht 5' 11 (1.803 m)   Wt 188 lb (85.3 kg)   SpO2 97%   BMI 26.22 kg/m   BP Readings from Last 3 Encounters:   04/14/24 (!) 144/79  04/03/24 (!) 150/98  03/04/24 (!) 155/74    Wt Readings from Last 3 Encounters:  04/14/24 188 lb (85.3 kg)  04/03/24 185 lb 12.8 oz (84.3 kg)  03/04/24 184 lb (83.5 kg)     Physical Exam Vitals reviewed.  Constitutional:      Appearance: He is well-developed.  HENT:     Head: Normocephalic and atraumatic.     Right Ear: External ear normal.     Left Ear: External ear normal.     Mouth/Throat:     Pharynx: No oropharyngeal exudate or posterior oropharyngeal erythema.  Eyes:     Pupils: Pupils are equal, round, and reactive to light.  Cardiovascular:     Rate and Rhythm: Normal rate and regular rhythm.     Heart sounds: No murmur heard. Pulmonary:     Effort: No respiratory distress.     Breath sounds: Normal breath sounds.  Musculoskeletal:     Cervical back: Normal range of motion and neck supple.  Neurological:     Mental Status: He is alert and oriented to person, place, and time.    Physical Exam GENERAL: Alert, cooperative, well developed, no acute distress HEENT: Normocephalic, normal oropharynx, moist mucous membranes CHEST: Clear to auscultation bilaterally, No wheezes, rhonchi, or crackles CARDIOVASCULAR: Normal heart rate and rhythm, S1 and S2 normal without murmurs ABDOMEN: Soft, non-tender, non-distended, without organomegaly, Normal bowel sounds EXTREMITIES: No cyanosis or edema NEUROLOGICAL: Cranial nerves grossly intact, Moves all extremities without gross motor or sensory deficit   Assessment & Plan:  Mixed hyperlipidemia -     CMP14+EGFR; Future -     Lipid panel; Future  Primary hypertension  Primary prostate cancer (HCC)  Nasal congestion  Other orders -     Omeprazole ; Take 1 capsule (20 mg total) by mouth daily.  Dispense: 90 capsule; Refill: 3    Assessment and Plan Assessment & Plan Mixed hyperlipidemia   Cholesterol levels have decreased significantly with the current medication regimen, though LDL remains  slightly above target. The previous cardiologist's advice against medication was incorrect, as the current treatment has been effective. He is not taking the injectable medication due to cost and insurance issues. Potential liver function concerns with rosuvastatin  were discussed, but previous issues resolved with diet and cessation of hormone therapy. Continue the current regimen of Zetia  and rosuvastatin . Consider taking rosuvastatin  every third day during travel to [place]. Recheck cholesterol levels in three months.  Hypertension   Blood pressure is currently high. He is starting a clonidine  patch as prescribed by another provider. There are concerns about potential blood pressure decrease after hormone therapy ends. Monitor blood pressure closely, especially after hormone therapy ends, and adjust the clonidine  patch as needed based on blood pressure readings.  Primary prostate cancer, status post radiation and hormone therapy   He is undergoing hormone therapy with one month remaining. Testosterone  levels are low due to therapy and are expected to normalize after treatment. PSA levels will be monitored  as testosterone  levels increase. The oncologist anticipates improved technology in eight years for potential future treatments. Continue the current hormone therapy regimen and monitor PSA levels as testosterone  levels increase. Plan for potential future treatments if PSA levels rise.  Elevated liver enzymes, monitoring for statin and hormone therapy effects   Previous elevation in liver enzymes was possibly related to hormone therapy and resolved after cessation. There are concerns about potential liver function issues with rosuvastatin , but previous issues resolved with diet and cessation of hormone therapy. Monitor liver function tests, especially if rosuvastatin  is taken during travel. Ensure access to liver function testing in [place] if needed.  Gastroesophageal reflux disease (GERD)    Omeprazole  is effective for GERD symptoms. There are concerns about long-term use and potential bone health effects, though no evidence of increased cancer risk with long-term use. Discussed potential bone health concerns and the importance of calcium  supplementation. Continue omeprazole  as needed for GERD symptoms. Consider bone density testing if fractures occur and ensure adequate calcium  intake.  Nasal congestion   Fluticasone  propionate is effective for nasal congestion. He requested a refill for travel, and the prescription was refilled at the pharmacy.       Follow-up: Return in about 3 months (around 07/13/2024) for hypertension, cholesterol.  Butler Der, M.D.

## 2024-04-15 ENCOUNTER — Ambulatory Visit
Admission: RE | Admit: 2024-04-15 | Discharge: 2024-04-15 | Disposition: A | Source: Ambulatory Visit | Attending: Radiation Oncology

## 2024-04-15 ENCOUNTER — Other Ambulatory Visit: Payer: Self-pay

## 2024-04-15 DIAGNOSIS — C61 Malignant neoplasm of prostate: Secondary | ICD-10-CM | POA: Diagnosis not present

## 2024-04-15 LAB — RAD ONC ARIA SESSION SUMMARY
Course Elapsed Days: 35
Plan Fractions Treated to Date: 27
Plan Prescribed Dose Per Fraction: 2.5 Gy
Plan Total Fractions Prescribed: 28
Plan Total Prescribed Dose: 70 Gy
Reference Point Dosage Given to Date: 67.5 Gy
Reference Point Session Dosage Given: 2.5 Gy
Session Number: 27

## 2024-04-16 ENCOUNTER — Ambulatory Visit

## 2024-04-16 ENCOUNTER — Ambulatory Visit
Admission: RE | Admit: 2024-04-16 | Discharge: 2024-04-16 | Disposition: A | Source: Ambulatory Visit | Attending: Radiation Oncology

## 2024-04-16 ENCOUNTER — Other Ambulatory Visit: Payer: Self-pay

## 2024-04-16 ENCOUNTER — Ambulatory Visit
Admission: RE | Admit: 2024-04-16 | Discharge: 2024-04-16 | Disposition: A | Discharge status: Home / Self Care | Source: Ambulatory Visit | Attending: Radiation Oncology | Admitting: Radiation Oncology

## 2024-04-16 DIAGNOSIS — C61 Malignant neoplasm of prostate: Secondary | ICD-10-CM

## 2024-04-16 LAB — RAD ONC ARIA SESSION SUMMARY
Course Elapsed Days: 36
Plan Fractions Treated to Date: 28
Plan Prescribed Dose Per Fraction: 2.5 Gy
Plan Total Fractions Prescribed: 28
Plan Total Prescribed Dose: 70 Gy
Reference Point Dosage Given to Date: 70 Gy
Reference Point Session Dosage Given: 2.5 Gy
Session Number: 28

## 2024-04-16 MED ORDER — TADALAFIL 20 MG PO TABS: 20.0000 mg | ORAL_TABLET | Freq: Every day | ORAL | 0 refills | Status: AC | PRN

## 2024-04-17 ENCOUNTER — Telehealth: Payer: Self-pay

## 2024-04-17 ENCOUNTER — Ambulatory Visit

## 2024-04-17 NOTE — Telephone Encounter (Signed)
 RN called patient per Sabra Rusk, PA-C had concerns about ED medications.  Patient was made aware or instructions and to follow up with his urologist if other options are needed.

## 2024-04-17 NOTE — Radiation Completion Notes (Addendum)
" °  Radiation Oncology         [phone] ________________________________  Name: Raymond Vega MRN: [id number]  Date: 04/16/2024  DOB: [birth year]  Date of Birth: 18-Nov-1950 Referring Physician: LEVEDA ATLAS, M.D. Date of Service: 2024-04-17 Radiation Oncologist: Adina Barge, M.D. [facility]     RADIATION ONCOLOGY END OF TREATMENT NOTE     Diagnosis: 73 y.o. gentleman with Stage T1c adenocarcinoma of the prostate with Gleason score of 4+3, and PSA of 15.7.   Intent: Curative     ==========DELIVERED PLANS==========  First Treatment Date: 2024-03-11 Last Treatment Date: 2024-04-16   Plan Name: Prostate Site: Prostate Technique: IMRT Mode: Photon Dose Per Fraction: 2.5 Gy Prescribed Dose (Delivered / Prescribed): 70 Gy / 70 Gy Prescribed Fxs (Delivered / Prescribed): 28 / 28     ==========ON TREATMENT VISIT DATES========== 2024-03-13, 2024-03-20, 2024-03-27, 2024-04-03, 2024-04-08, 2024-04-16    See weekly On Treatment Notes in Epic for details in the Media tab (listed as Progress notes on the On Treatment Visit Dates listed above).  The patient will receive a call in about one month from the radiation oncology department. He will continue follow up with his urologist, Dr. Deward Stalling, as well.  ------------------------------------------------   [name], MD [facility]  Radiation Oncology Direct Dial: [phone]  Fax: [phone] [email or web address]  Skype  LinkedIn    "

## 2024-04-20 ENCOUNTER — Ambulatory Visit

## 2024-04-21 ENCOUNTER — Ambulatory Visit

## 2024-04-22 ENCOUNTER — Ambulatory Visit

## 2024-04-23 ENCOUNTER — Ambulatory Visit

## 2024-04-24 ENCOUNTER — Ambulatory Visit

## 2024-04-27 ENCOUNTER — Ambulatory Visit

## 2024-05-05 NOTE — Progress Notes (Signed)
" °  Radiation Oncology         [phone] ________________________________  Name: Raymond Vega MRN: [id number]  Date of Service: 05/19/2024  DOB: [birth year]  Post Treatment Telephone Note  Diagnosis:  Malignant neoplasm of prostate   First Treatment Date: 2024-03-11 Last Treatment Date: 2024-04-16   Plan Name: Prostate Site: Prostate Technique: IMRT Mode: Photon Dose Per Fraction: 2.5 Gy Prescribed Dose (Delivered / Prescribed): 70 Gy / 70 Gy Prescribed Fxs (Delivered / Prescribed): 28 / 28  Pre Treatment IPSS Score: 15 (as documented in the provider consult note)  The patient was available for call today.   Symptoms of fatigue have not improved since completing therapy.  Symptoms of bladder changes have not improved since completing therapy. Current symptoms include frequency, and medications for bladder symptoms include N/A.  Symptoms of bowel changes have improved since completing therapy. Current symptoms include some constipation, advised patient to try stool softeners/Miralax/increase fluids.  Post Treatment IPSS Score: IPSS Questionnaire (AUA-7): Over the past month   1)  How often have you had a sensation of not emptying your bladder completely after you finish urinating?  2 - Less than half the time  2)  How often have you had to urinate again less than two hours after you finished urinating? 4 - More than half the time  3)  How often have you found you stopped and started again several times when you urinated?  0 - Not at all  4) How difficult have you found it to postpone urination?  4 - More than half the time  5) How often have you had a weak urinary stream?  0 - Not at all  6) How often have you had to push or strain to begin urination?  0 - Not at all  7) How many times did you most typically get up to urinate from the time you went to bed until the time you got up in the morning?  5 - 5+ times  Total score:  15. Which indicates moderate symptoms  0-7  mildly symptomatic   8-19 moderately symptomatic   20-35 severely symptomatic   Patient does not currently have any scheduled follow up with his urologist to his knowledge so he was advised to call to schedule his post-treatment follow up with Dr. Bart Frizzell for ongoing surveillance. He was counseled that PSA levels will be drawn in the urology office, and was reassured that additional time is expected to improve bowel and bladder symptoms. He was encouraged to call back with concerns or questions regarding radiation.   "

## 2024-05-18 ENCOUNTER — Other Ambulatory Visit: Payer: Self-pay | Admitting: Family Medicine

## 2024-05-18 DIAGNOSIS — I10 Essential (primary) hypertension: Secondary | ICD-10-CM

## 2024-05-19 ENCOUNTER — Ambulatory Visit
Admission: RE | Admit: 2024-05-19 | Discharge: 2024-05-19 | Disposition: A | Discharge status: Home / Self Care | Source: Ambulatory Visit | Attending: Radiation Oncology | Admitting: Radiation Oncology

## 2024-05-19 DIAGNOSIS — C61 Malignant neoplasm of prostate: Secondary | ICD-10-CM

## 2024-05-25 ENCOUNTER — Other Ambulatory Visit (HOSPITAL_COMMUNITY): Payer: Self-pay

## 2024-05-25 ENCOUNTER — Telehealth: Payer: Self-pay

## 2024-05-25 NOTE — Telephone Encounter (Signed)
 Raymond Vega, 74 y.o. gentleman with Stage T1c adenocarcinoma of the prostate with Gleason score of 4+3, and PSA of 15.7.   Completed treatment on 04/16/2024.  Post treatment call 05/19/2024.  [place] of side effects (nocturia, urinary frequency, constipation, and nausea that was addressed during his post treatment call with Tammy.  Raymond Vega called to report having some pain in the prostate area and growth on back of his foot he notice 1 week ago and pain to bilateral  knees/feet.  Denies hematuria, dysuria, or urinary retention.  Raymond Vega was told that the growth to back of foot wasn't related to radiation.    Raymond Vega also report occasional headaches with congestion.  RN advised him to follow up with his PCP/urologist for those new symptoms and that the side effects from the radiation will subside that it will take some time.  He hasn't followed up with the urologist yet due to be [place] the country.

## 2024-06-04 ENCOUNTER — Telehealth: Payer: Self-pay

## 2024-06-04 NOTE — Telephone Encounter (Signed)
 Mr. Garrido called again he didn't follow suggestions from last conversation stressed that he really wanted to speak to you about concerns for the future.  [place] is still [place].  I advised him to follow up with his PCP/urologist for those new symptoms and that the side effects from the radiation will subside that it will take some time.  He hasn't followed up with the urologist yet due to be [place] the country.  Requested to speak with Dr. Patrcia in regards to his future and what's next.  RN informed Dr. Patrcia of request and left best contact number for patient.

## 2024-06-05 ENCOUNTER — Telehealth: Payer: Self-pay | Admitting: Family Medicine

## 2024-06-05 NOTE — Telephone Encounter (Unsigned)
 Copied from CRM #[id number]. Topic: Clinical - Request for Lab/Test Order >> Jun 05, 2024  4:05 PM Selinda RAMAN wrote: Reason for CRM: The patient called in stating he wants to make sure his PSA and Testerone are including in the labs he will have drawn on 07/03/23 as his Oncologist has requested this. He had all 4 things drawn back on 11/21 but his new lab orders only included Lipid, CMP & EGFR. Please assist patient further.

## 2024-06-07 ENCOUNTER — Other Ambulatory Visit: Payer: Self-pay | Admitting: Family Medicine

## 2024-06-07 DIAGNOSIS — E782 Mixed hyperlipidemia: Secondary | ICD-10-CM

## 2024-06-08 ENCOUNTER — Other Ambulatory Visit: Payer: Self-pay | Admitting: Family Medicine

## 2024-06-08 DIAGNOSIS — N401 Enlarged prostate with lower urinary tract symptoms: Secondary | ICD-10-CM

## 2024-06-08 NOTE — Telephone Encounter (Signed)
PSA and Testosterone added.

## 2024-06-19 ENCOUNTER — Other Ambulatory Visit: Payer: Self-pay | Admitting: Urology

## 2024-06-19 DIAGNOSIS — C61 Malignant neoplasm of prostate: Secondary | ICD-10-CM

## 2024-06-29 ENCOUNTER — Ambulatory Visit: Admitting: Pharmacist Clinician (PhC)/ Clinical Pharmacy Specialist

## 2024-07-02 ENCOUNTER — Other Ambulatory Visit

## 2024-07-13 ENCOUNTER — Ambulatory Visit: Admitting: Family Medicine

## 2025-01-26 ENCOUNTER — Ambulatory Visit: Payer: Self-pay
